# Patient Record
Sex: Male | Born: 1965 | Race: White | Hispanic: No | Marital: Married | Smoking: Former smoker
Health system: Southern US, Community
[De-identification: ages and names within clinical notes are randomized; demographics above are authoritative.]

## PROBLEM LIST (undated history)

## (undated) DIAGNOSIS — K219 Gastro-esophageal reflux disease without esophagitis: Secondary | ICD-10-CM

## (undated) DIAGNOSIS — I219 Acute myocardial infarction, unspecified: Secondary | ICD-10-CM

## (undated) DIAGNOSIS — I10 Essential (primary) hypertension: Secondary | ICD-10-CM

## (undated) HISTORY — DX: Essential (primary) hypertension: I10

## (undated) HISTORY — PX: OTHER SURGICAL HISTORY: SHX169

## (undated) HISTORY — DX: Acute myocardial infarction, unspecified: I21.9

## (undated) HISTORY — DX: Gastro-esophageal reflux disease without esophagitis: K21.9

## (undated) HISTORY — PX: FINGER SURGERY: SHX640

---

## 2004-06-01 ENCOUNTER — Other Ambulatory Visit: Payer: Self-pay

## 2004-08-07 ENCOUNTER — Other Ambulatory Visit: Payer: Self-pay

## 2004-08-07 ENCOUNTER — Inpatient Hospital Stay: Payer: Self-pay | Admitting: Internal Medicine

## 2004-09-14 ENCOUNTER — Emergency Department: Payer: Self-pay | Admitting: Emergency Medicine

## 2005-02-05 ENCOUNTER — Emergency Department: Payer: Self-pay | Admitting: Emergency Medicine

## 2005-07-01 ENCOUNTER — Emergency Department: Payer: Self-pay | Admitting: Emergency Medicine

## 2005-07-03 ENCOUNTER — Emergency Department: Payer: Self-pay | Admitting: Emergency Medicine

## 2006-10-27 ENCOUNTER — Emergency Department: Payer: Self-pay | Admitting: Emergency Medicine

## 2007-04-17 ENCOUNTER — Emergency Department: Payer: Self-pay | Admitting: Emergency Medicine

## 2007-05-29 ENCOUNTER — Emergency Department: Payer: Self-pay | Admitting: Emergency Medicine

## 2007-05-30 ENCOUNTER — Inpatient Hospital Stay: Payer: Self-pay | Admitting: Internal Medicine

## 2007-06-03 ENCOUNTER — Emergency Department: Payer: Self-pay | Admitting: Emergency Medicine

## 2007-06-03 ENCOUNTER — Other Ambulatory Visit: Payer: Self-pay

## 2008-12-11 ENCOUNTER — Emergency Department: Payer: Self-pay | Admitting: Emergency Medicine

## 2009-05-01 ENCOUNTER — Emergency Department: Payer: Self-pay | Admitting: Emergency Medicine

## 2009-08-15 ENCOUNTER — Inpatient Hospital Stay: Payer: Self-pay | Admitting: Cardiovascular Disease

## 2009-12-19 ENCOUNTER — Emergency Department: Payer: Self-pay | Admitting: Emergency Medicine

## 2010-10-13 IMAGING — CT CT STONE STUDY
1 of 2 series · 15 of 32 positions shown, 19 images · non-contrast
Comparison: none

REASON FOR EXAM: L FLANK PAIN & HEMATURIA - EVAL STONE
COMMENTS:

[Series 2: stone · axial · 0.77mm/px · z∈[+10,+472]mm · 15 of 174 slices shown, 19 images]
[im 13/174  soft-tissue]
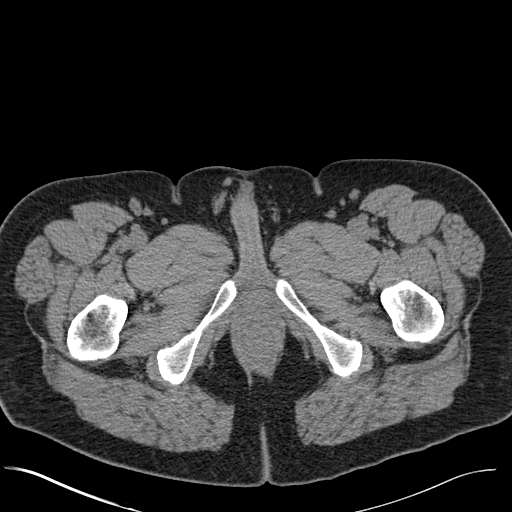
[im 13/174  bone]
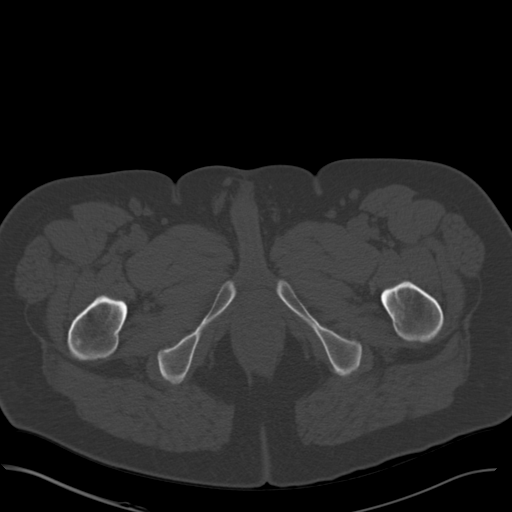
[im 25/174  soft-tissue]
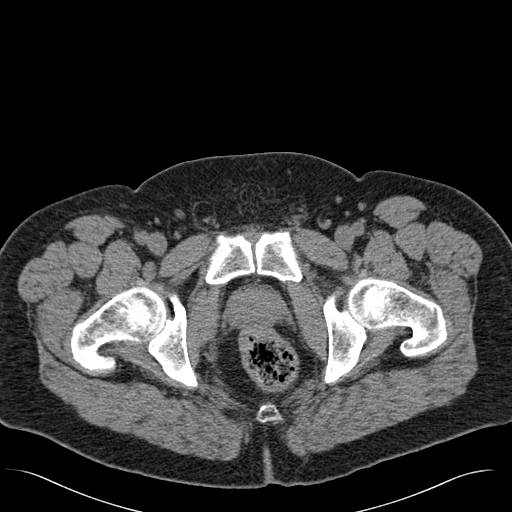
[im 38/174  soft-tissue]
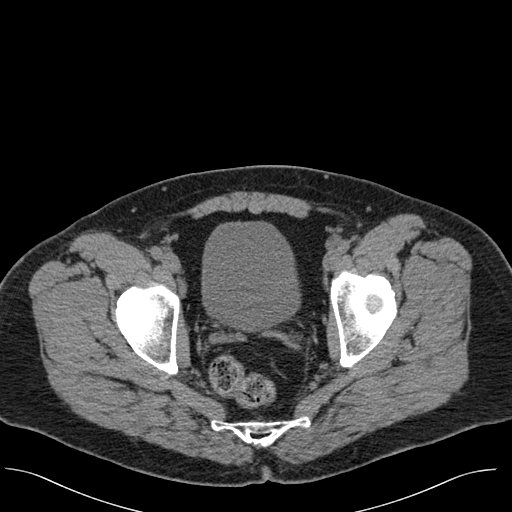
[im 50/174  soft-tissue]
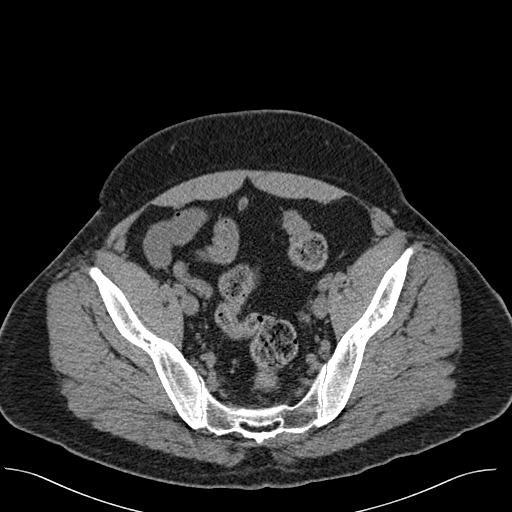
[im 62/174  soft-tissue]
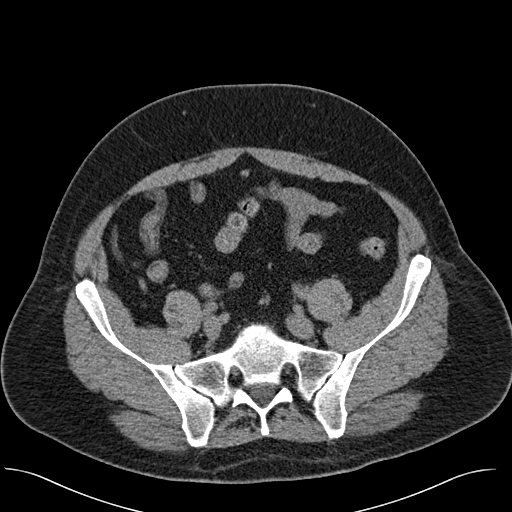
[im 75/174  soft-tissue]
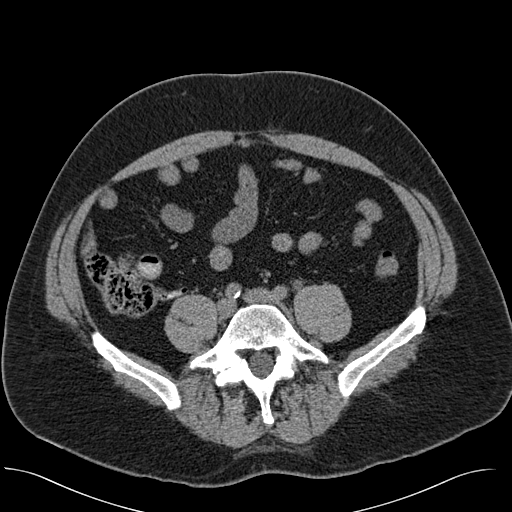
[im 87/174  soft-tissue]
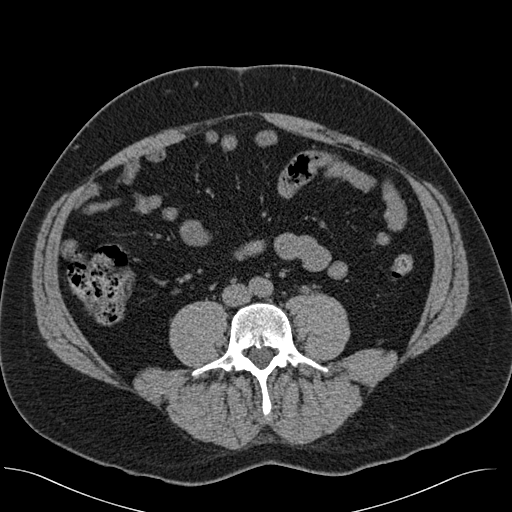
[im 99/174  soft-tissue]
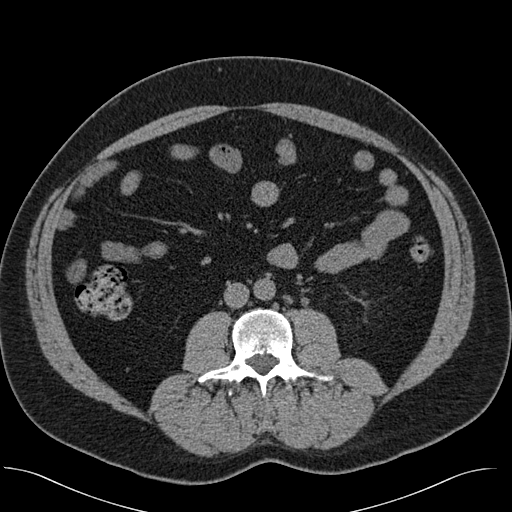
[im 112/174  soft-tissue]
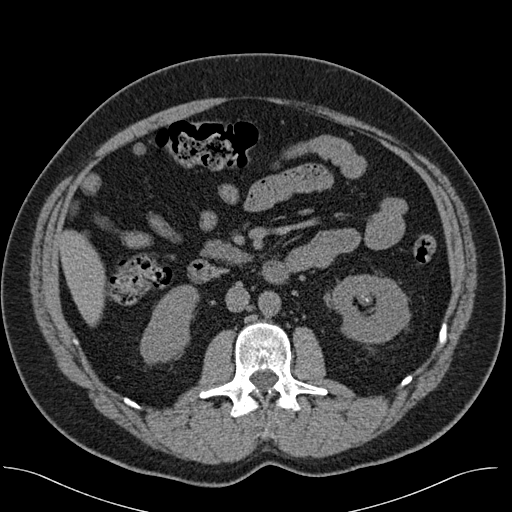
[im 112/174  bone]
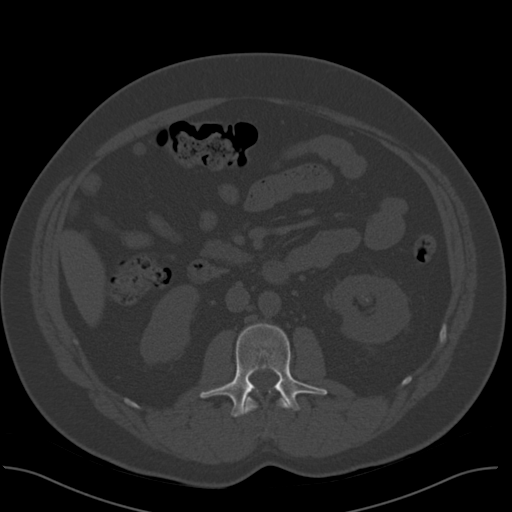
[im 124/174  soft-tissue]
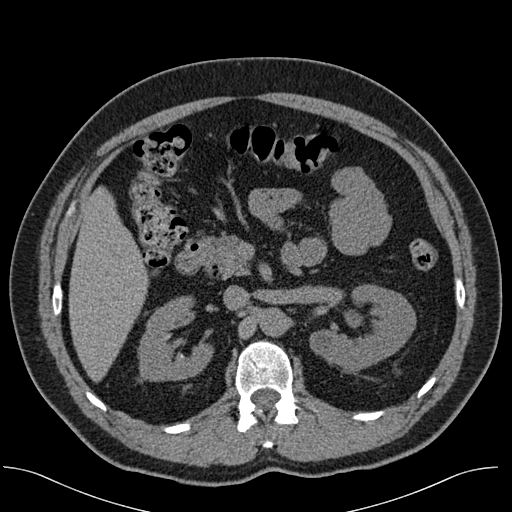
[im 136/174  soft-tissue]
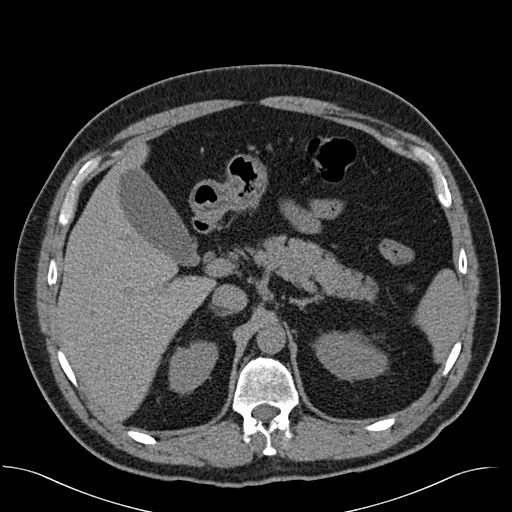
[im 149/174  soft-tissue]
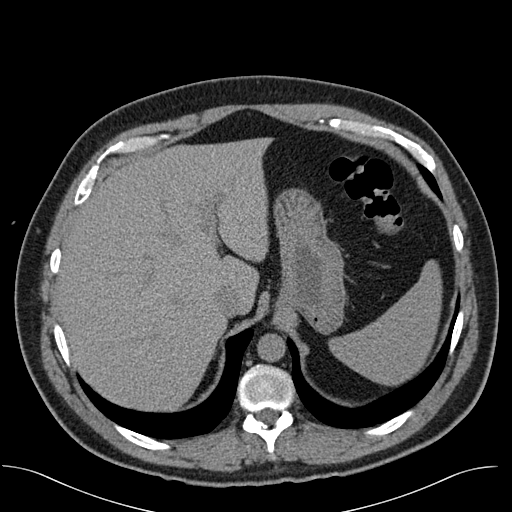
[im 149/174  lung]
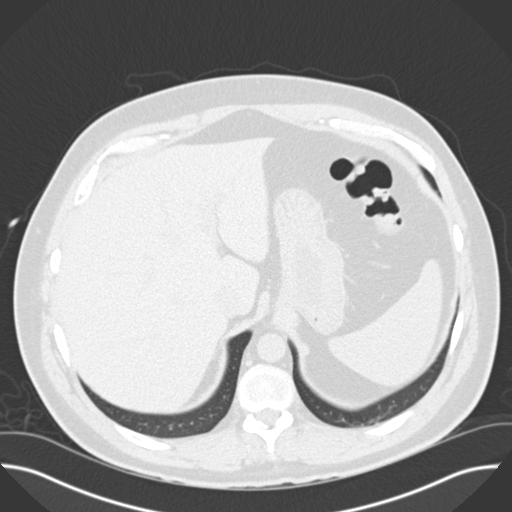
[im 155/174  lung]
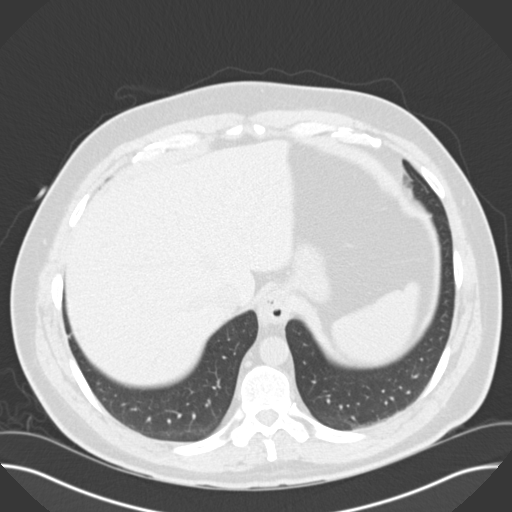
[im 161/174  soft-tissue]
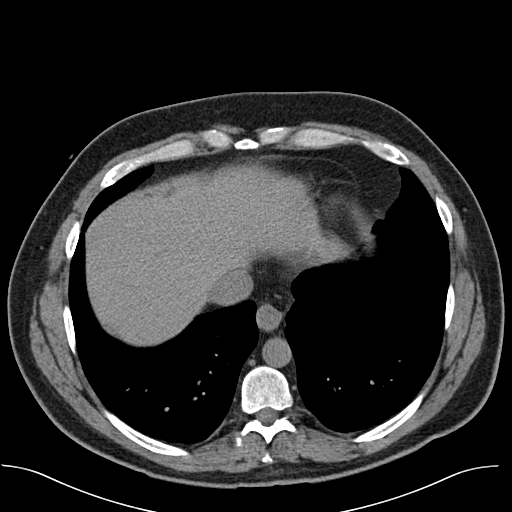
[im 161/174  lung]
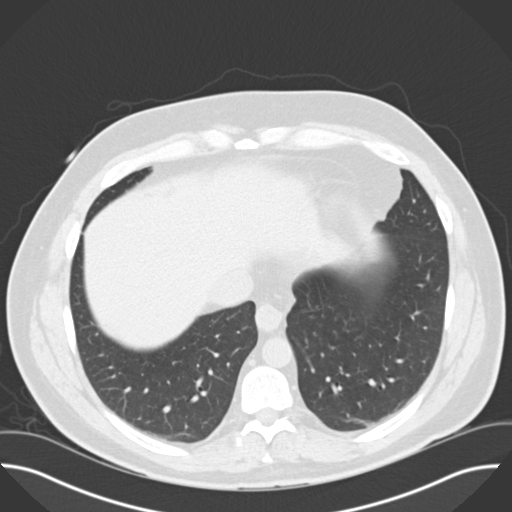
[im 167/174  lung]
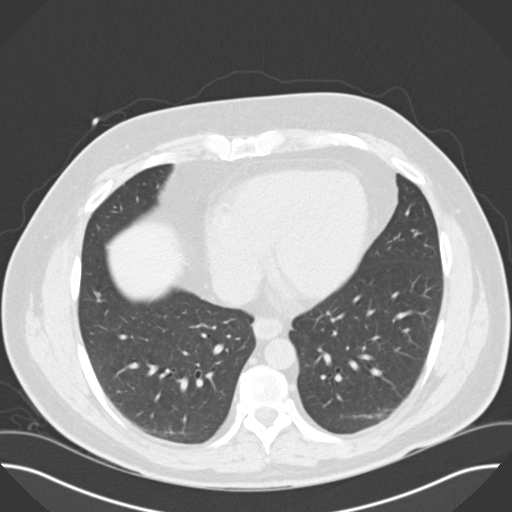

[15 of 32 positions shown; findings below may reference images not displayed]

PROCEDURE:     CT  - CT ABDOMEN /PELVIS WO (STONE)  - December 11, 2008 [DATE]

RESULT:     Emergent noncontrast CT of the abdomen and pelvis demonstrates a
3 to 4 mm distal left ureteral stone on image #127 with
hydroureteronephrosis proximal to that. The appendix is normal.
Atherosclerotic calcification is present. Bilateral nephrolithiasis is
present. There is some perinephric stranding on the left. There is no
abnormal bowel distention. No radiopaque gallstones are evident. There is a
small hiatal hernia. There is a fat-filled umbilical hernia. There appears
to be fat within a left inguinal hernia. The abdominal viscera appear to be
within normal limits otherwise for a noncontrast exam.
IMPRESSION: 1.     Distal left ureteral 3 to 4 mm calculus in with mild obstructive
changes.
2.     Bilateral nephrolithiasis.
3.     Colonic diverticula are demonstrated but there is no evidence of
acute diverticulitis.

## 2011-06-17 IMAGING — CR DG CHEST 1V PORT
1 series · 1 of 1 positions shown · non-contrast
Comparison: none

REASON FOR EXAM: cp
COMMENTS:

PROCEDURE:     DXR - DXR PORTABLE CHEST SINGLE VIEW  - August 15, 2009  [DATE]
RESULT:     Comparison is made to the previous examination of 06/03/2007.
Monitoring electrodes are present. The cardiac silhouette is borderline
enlarged. There is no edema, infiltrate, effusion or pneumothorax.

[view not recorded]
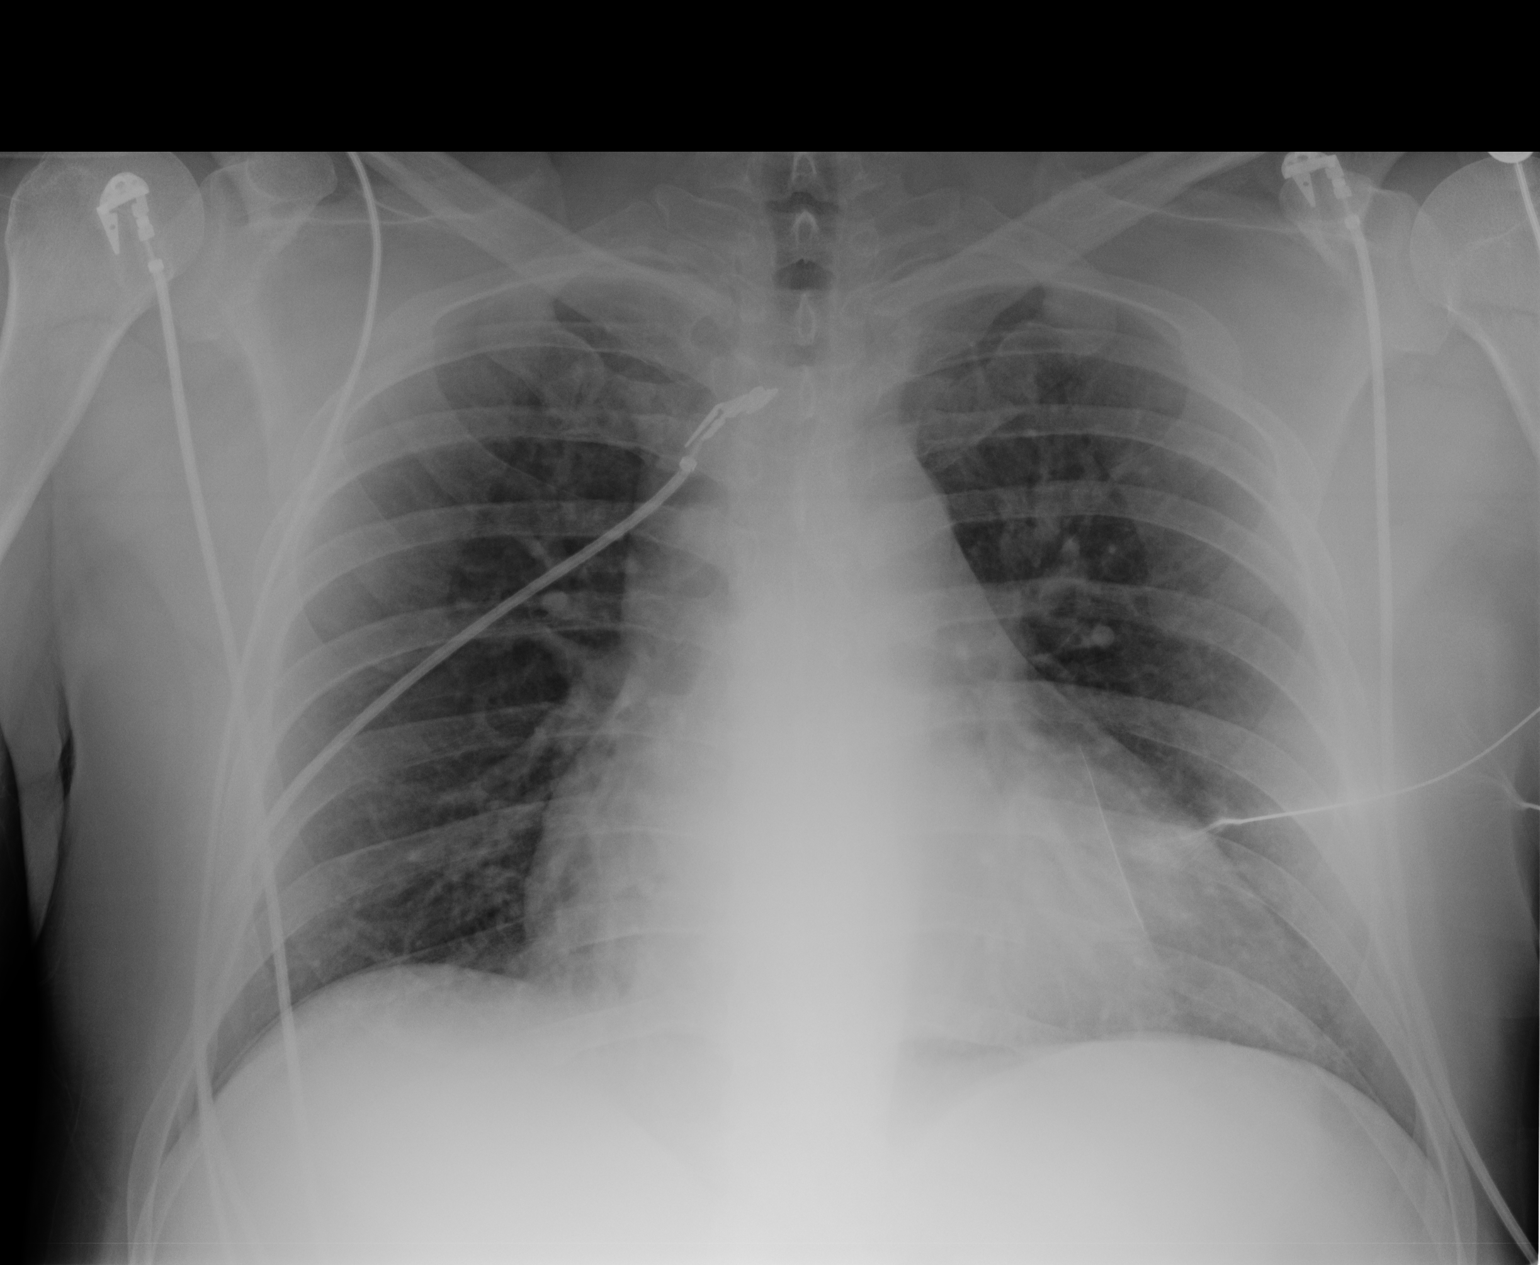

[1 of 1 positions shown; findings below may reference images not displayed]

IMPRESSION: 1.     Cardiomegaly, stable.
2.     No acute cardiopulmonary disease evident radiographically.

## 2011-10-21 IMAGING — CR DG CHEST 1V PORT
1 series · 1 of 1 positions shown · non-contrast
Comparison: none

REASON FOR EXAM: cp
COMMENTS:

[view not recorded]
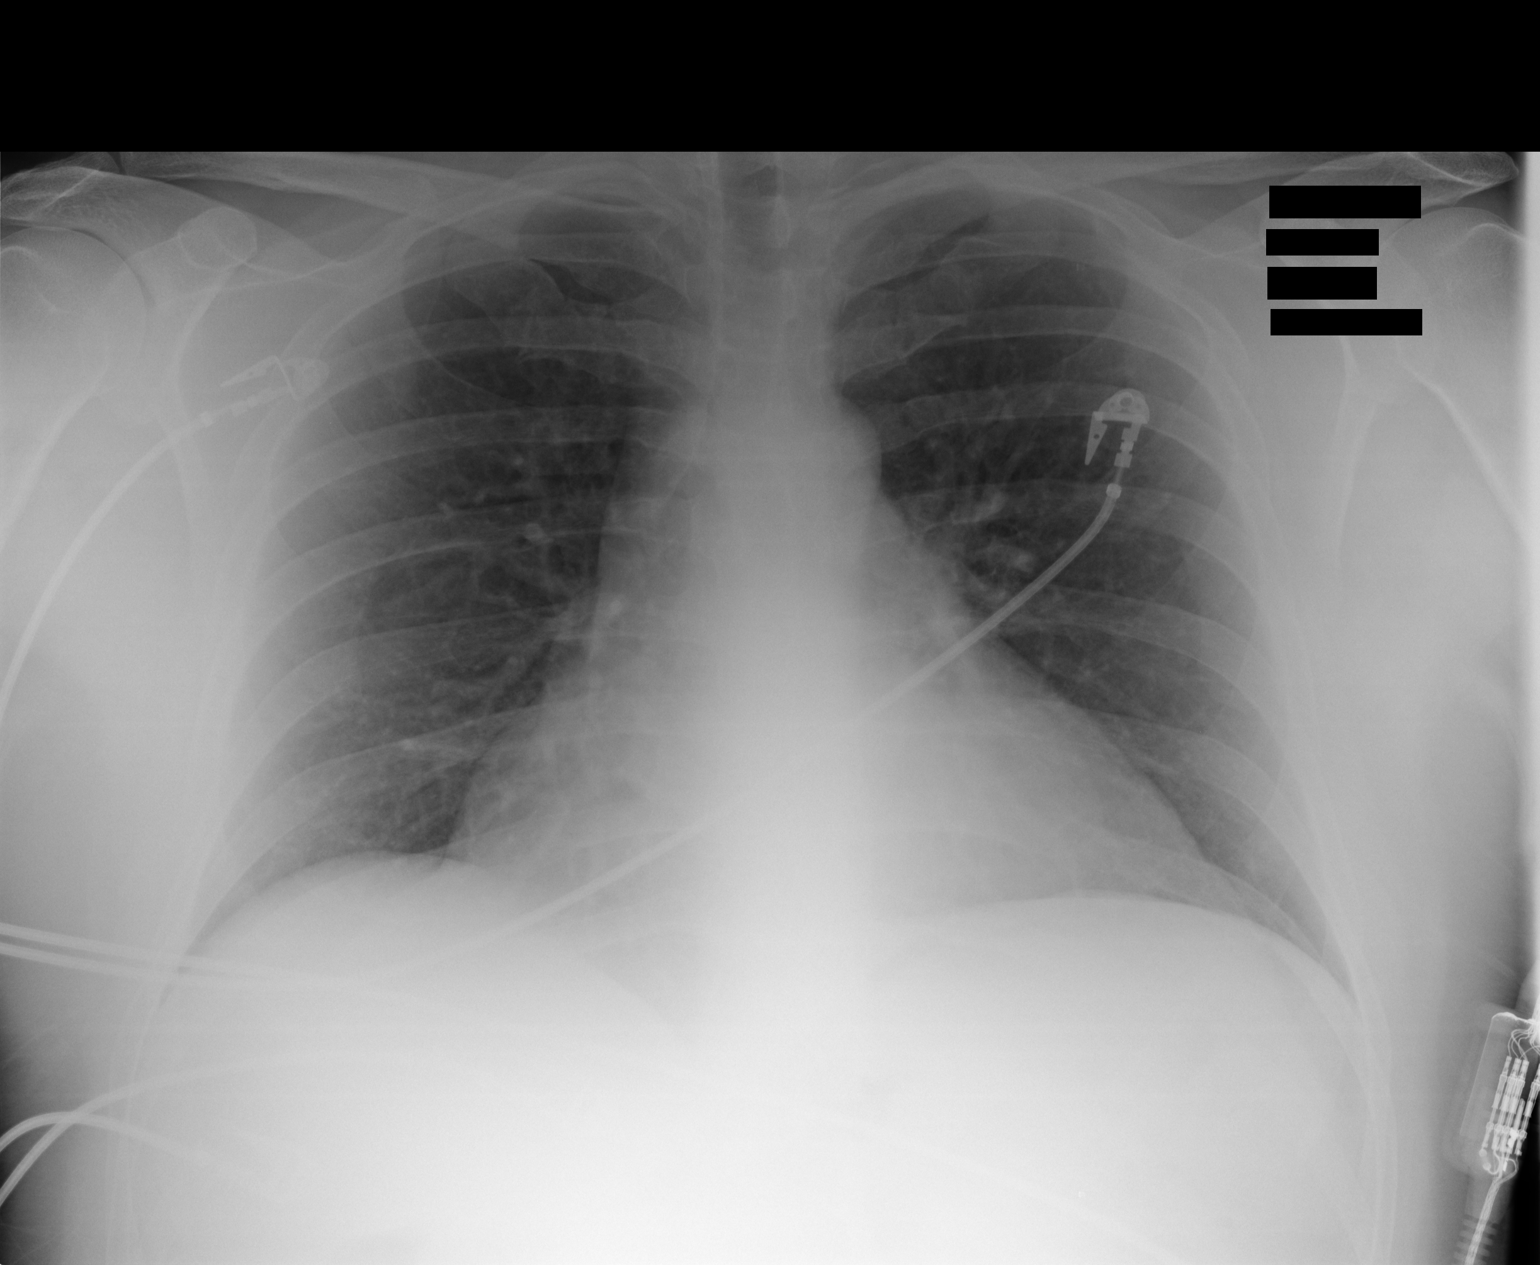

[1 of 1 positions shown; findings below may reference images not displayed]

PROCEDURE:     DXR - DXR PORTABLE CHEST SINGLE VIEW  - December 19, 2009 [DATE]

RESULT:      Comparison is made to a prior study dated 08/15/2009.

The cardiac silhouette is enlarged indicative of cardiomegaly. No focal
regions of consolidation or focal infiltrates. The visualized bony skeleton
is unremarkable.
IMPRESSION: Cardiomegaly without evidence of acute cardiopulmonary disease.

## 2012-03-03 ENCOUNTER — Emergency Department: Payer: Self-pay | Admitting: Emergency Medicine

## 2013-10-27 ENCOUNTER — Emergency Department: Payer: Self-pay | Admitting: Emergency Medicine

## 2019-04-14 ENCOUNTER — Encounter: Payer: Self-pay | Admitting: Adult Health

## 2019-04-14 ENCOUNTER — Ambulatory Visit (INDEPENDENT_AMBULATORY_CARE_PROVIDER_SITE_OTHER): Payer: BC Managed Care – PPO | Admitting: Adult Health

## 2019-04-14 ENCOUNTER — Other Ambulatory Visit: Payer: Self-pay

## 2019-04-14 VITALS — BP 146/110 | HR 79 | Resp 16 | Ht 74.0 in | Wt 241.0 lb

## 2019-04-14 DIAGNOSIS — F419 Anxiety disorder, unspecified: Secondary | ICD-10-CM | POA: Diagnosis not present

## 2019-04-14 DIAGNOSIS — L409 Psoriasis, unspecified: Secondary | ICD-10-CM | POA: Diagnosis not present

## 2019-04-14 DIAGNOSIS — Z1211 Encounter for screening for malignant neoplasm of colon: Secondary | ICD-10-CM | POA: Diagnosis not present

## 2019-04-14 DIAGNOSIS — I10 Essential (primary) hypertension: Secondary | ICD-10-CM | POA: Diagnosis not present

## 2019-04-14 MED ORDER — LISINOPRIL 10 MG PO TABS: 10.0000 mg | ORAL_TABLET | Freq: Every day | ORAL | 1 refills | Status: DC

## 2019-04-14 MED ORDER — CLOBETASOL PROPIONATE 0.05 % EX FOAM: Freq: Two times a day (BID) | CUTANEOUS | 0 refills | Status: DC

## 2019-04-14 MED ORDER — TRIAMCINOLONE ACETONIDE 0.5 % EX OINT: 1.0000 "application " | TOPICAL_OINTMENT | Freq: Two times a day (BID) | CUTANEOUS | 0 refills | Status: DC

## 2019-04-14 MED ORDER — ALPRAZOLAM 0.25 MG PO TABS: ORAL_TABLET | ORAL | 0 refills | Status: DC

## 2019-04-14 NOTE — Progress Notes (Signed)
[facility] [place], [place] [address]  Internal MEDICINE  Office Visit Note  Patient Name: Robert Clark  [id number]  [id number]  Date of Service: 04/14/2019   Complaints/HPI Pt is here for establishment of PCP. Chief Complaint  Patient presents with  . Medical Management of Chronic Issues    new patient , noticing little more fatigue   . Hypertension    bp is elevated , use to take bp medication but has not since stop seeing pcp   . Quality Metric Gaps    physical and colonscopy    HPI Pt is here to establish care.  He reports it has been a few years since he has seen a PCP. He reports feeling mostly well.  He reports he did take xanax for work stress, as well as a cholesterol medicine and blood pressure medicine. He has a history of MI at 53 yo with one stent placed. His blood pressure is elevated, and he reports he has not been able to get refills.     Current Medication: Outpatient Encounter Medications as of 04/14/2019  Medication Sig  . aspirin EC 81 MG tablet Take 81 mg by mouth daily.  . Cholecalciferol (D3 VITAMIN PO) Take by mouth.  . Cyanocobalamin ([employer] B12 GUMMIES) 500 MCG CHEW Chew 1,000 mcg by mouth 2 (two) times daily.  . Omega-3 Fatty Acids (FISH OIL) 1200 MG CAPS Take 2 capsules by mouth daily.  . Omeprazole 20 MG TBEC Take 20 mg by mouth daily.   No facility-administered encounter medications on file as of 04/14/2019.     Surgical History: Past Surgical History:  Procedure Laterality Date  . FINGER SURGERY    . heart stent      Medical History: Past Medical History:  Diagnosis Date  . GERD (gastroesophageal reflux disease)   . Heart attack ([place])   . Hypertension     Family History: Family History  Problem Relation Age of Onset  . Heart disease Father   . Heart disease Maternal Grandmother   . Heart disease Paternal Grandfather     Social History   Socioeconomic History  . Marital status: Single    Spouse name:  Not on file  . Number of children: Not on file  . Years of education: Not on file  . Highest education level: Not on file  Occupational History  . Not on file  Social Needs  . Financial resource strain: Not on file  . Food insecurity    Worry: Not on file    Inability: Not on file  . Transportation needs    Medical: Not on file    Non-medical: Not on file  Tobacco Use  . Smoking status: Former Research scientist (life sciences)  . Smokeless tobacco: Never Used  Substance and Sexual Activity  . Alcohol use: Not Currently  . Drug use: Never  . Sexual activity: Not on file  Lifestyle  . Physical activity    Days per week: Not on file    Minutes per session: Not on file  . Stress: Not on file  Relationships  . Social Herbalist on phone: Not on file    Gets together: Not on file    Attends religious service: Not on file    Active member of club or organization: Not on file    Attends meetings of clubs or organizations: Not on file    Relationship status: Not on file  . Intimate partner violence  Fear of current or ex partner: Not on file    Emotionally abused: Not on file    Physically abused: Not on file    Forced sexual activity: Not on file  Other Topics Concern  . Not on file  Social History Narrative  . Not on file     Review of Systems  Constitutional: Negative.  Negative for chills, fatigue and unexpected weight change.  HENT: Negative.  Negative for congestion, rhinorrhea, sneezing and sore throat.   Eyes: Negative for redness.  Respiratory: Negative.  Negative for cough, chest tightness and shortness of breath.   Cardiovascular: Negative.  Negative for chest pain and palpitations.  Gastrointestinal: Negative.  Negative for abdominal pain, constipation, diarrhea, nausea and vomiting.  Endocrine: Negative.   Genitourinary: Negative.  Negative for dysuria and frequency.  Musculoskeletal: Negative.  Negative for arthralgias, back pain, joint swelling and neck pain.  Skin:  Negative.  Negative for rash.  Allergic/Immunologic: Negative.   Neurological: Negative.  Negative for tremors and numbness.  Hematological: Negative for adenopathy. Does not bruise/bleed easily.  Psychiatric/Behavioral: Negative.  Negative for behavioral problems, sleep disturbance and suicidal ideas. The patient is not nervous/anxious.     Vital Signs: BP (!) 146/110   Pulse 79   Resp 16   Ht 6\' 2"  (1.88 m)   Wt 241 lb (109.3 kg)   SpO2 96%   BMI 30.94 kg/m    Physical Exam Vitals signs and nursing note reviewed.  Constitutional:      General: He is not in acute distress.    Appearance: He is well-developed. He is not diaphoretic.  HENT:     Head: Normocephalic and atraumatic.     Mouth/Throat:     Pharynx: No oropharyngeal exudate.  Eyes:     Pupils: Pupils are equal, round, and reactive to light.  Neck:     Musculoskeletal: Normal range of motion and neck supple.     Thyroid: No thyromegaly.     Vascular: No JVD.     Trachea: No tracheal deviation.  Cardiovascular:     Rate and Rhythm: Normal rate and regular rhythm.     Heart sounds: Normal heart sounds. No murmur. No friction rub. No gallop.   Pulmonary:     Effort: Pulmonary effort is normal. No respiratory distress.     Breath sounds: Normal breath sounds. No wheezing or rales.  Chest:     Chest wall: No tenderness.  Abdominal:     Palpations: Abdomen is soft.     Tenderness: There is no abdominal tenderness. There is no guarding.  Musculoskeletal: Normal range of motion.  Lymphadenopathy:     Cervical: No cervical adenopathy.  Skin:    General: Skin is warm and dry.  Neurological:     Mental Status: He is alert and oriented to person, place, and time.     Cranial Nerves: No cranial nerve deficit.  Psychiatric:        Behavior: Behavior normal.        Thought Content: Thought content normal.        Judgment: Judgment normal.    Assessment/Plan: 1. Essential hypertension Refilled patients  lisinopril, will recheck patient in 2 weeks for effectiveness.   - lisinopril (ZESTRIL) 10 MG tablet; Take 1 tablet (10 mg total) by mouth daily.  Dispense: 30 tablet; Refill: 1  2. Anxiety Reviewed risks and possible side effects associated with taking opiates, benzodiazepines and other CNS depressants. Combination of these could cause dizziness and drowsiness.  Advised patient not to drive or operate machinery when taking these medications, as patient's and other's life can be at risk and will have consequences. Patient verbalized understanding in this matter. Dependence and abuse for these drugs will be monitored closely. A Controlled substance policy and procedure is on file which allows [facility] to order a urine drug screen test at any visit. Patient understands and agrees with the plan - ALPRAZolam (XANAX) 0.25 MG tablet; Half to one tab tab as needed for panic attacks  Dispense: 10 tablet; Refill: 0  3. Psoriasis Provided patient with refills on his psoriasis medications.  - clobetasol (OLUX) 0.05 % topical foam; Apply topically 2 (two) times daily.  Dispense: 100 g; Refill: 0 - triamcinolone ointment (KENALOG) 0.5 %; Apply 1 application topically 2 (two) times daily.  Dispense: 60 g; Refill: 0  4. Screening for colon cancer Pt needs colonoscopy, referral placed.  - Ambulatory referral to Gastroenterology  General Counseling: Myrla Halstedravis verbalizes understanding of the findings of todays visit and agrees with plan of treatment. I have discussed any further diagnostic evaluation that may be needed or ordered today. We also reviewed his medications today. he has been encouraged to call the office with any questions or concerns that should arise related to todays visit.  No orders of the defined types were placed in this encounter.   No orders of the defined types were placed in this encounter.   Time spent: 30 Minutes   This patient was seen by Blima LedgerAdam Janal Haak AGNP-C in  Collaboration with Dr Lyndon CodeFozia M Khan as a part of collaborative care agreement  Johnna AcostaAdam J. Marq Rebello AGNP-C Internal Medicine

## 2019-04-16 ENCOUNTER — Encounter: Payer: Self-pay | Admitting: Adult Health

## 2019-04-22 ENCOUNTER — Encounter: Payer: Self-pay | Admitting: *Deleted

## 2019-04-29 ENCOUNTER — Other Ambulatory Visit: Payer: Self-pay

## 2019-04-29 ENCOUNTER — Ambulatory Visit (INDEPENDENT_AMBULATORY_CARE_PROVIDER_SITE_OTHER): Payer: BC Managed Care – PPO | Admitting: Adult Health

## 2019-04-29 ENCOUNTER — Encounter: Payer: Self-pay | Admitting: Adult Health

## 2019-04-29 VITALS — BP 144/91 | HR 84 | Resp 16 | Ht 73.0 in | Wt 241.6 lb

## 2019-04-29 DIAGNOSIS — I10 Essential (primary) hypertension: Secondary | ICD-10-CM | POA: Diagnosis not present

## 2019-04-29 DIAGNOSIS — F419 Anxiety disorder, unspecified: Secondary | ICD-10-CM | POA: Diagnosis not present

## 2019-04-29 DIAGNOSIS — L409 Psoriasis, unspecified: Secondary | ICD-10-CM | POA: Diagnosis not present

## 2019-04-29 MED ORDER — TRIAMCINOLONE ACETONIDE 0.5 % EX OINT: 1.0000 "application " | TOPICAL_OINTMENT | Freq: Two times a day (BID) | CUTANEOUS | 2 refills | Status: DC

## 2019-04-29 MED ORDER — CLOBETASOL PROPIONATE 0.05 % EX LOTN: 1.0000 "application " | TOPICAL_LOTION | Freq: Two times a day (BID) | CUTANEOUS | 3 refills | Status: DC

## 2019-04-29 MED ORDER — ESCITALOPRAM OXALATE 10 MG PO TABS: 10.0000 mg | ORAL_TABLET | Freq: Every day | ORAL | 1 refills | Status: DC

## 2019-04-29 MED ORDER — ALPRAZOLAM 0.25 MG PO TABS: ORAL_TABLET | ORAL | 0 refills | Status: DC

## 2019-04-29 NOTE — Progress Notes (Signed)
[facility] [place], [place] [address]  Internal MEDICINE  Office Visit Note  Patient Name: Robert Clark  [id number]  [id number]  Date of Service: 04/29/2019  Chief Complaint  Patient presents with  . Medical Management of Chronic Issues    2 week follow up  . Hypertension    HPI  Pt is here for follow up on HTN.  He was started on lisinopril 10mg  daily, and has been taking it.  He reports his headaches have gotten much better.  He can remember 1-2 over the last two weeks.    Current Medication: Outpatient Encounter Medications as of 04/29/2019  Medication Sig  . ALPRAZolam (XANAX) 0.25 MG tablet Half to one tab tab as needed for panic attacks  . aspirin EC 81 MG tablet Take 81 mg by mouth daily.  . Cholecalciferol (D3 VITAMIN PO) Take by mouth.  . Cyanocobalamin ([employer]S B12 GUMMIES) 500 MCG CHEW Chew 1,000 mcg by mouth 2 (two) times daily.  [name] Kitchen lisinopril (ZESTRIL) 10 MG tablet Take 1 tablet (10 mg total) by mouth daily.  . Omega-3 Fatty Acids (FISH OIL) 1200 MG CAPS Take 2 capsules by mouth daily.  . Omeprazole 20 MG TBEC Take 20 mg by mouth daily.  [name] Kitchen triamcinolone ointment (KENALOG) 0.5 % Apply 1 application topically 2 (two) times daily.  . [DISCONTINUED] ALPRAZolam (XANAX) 0.25 MG tablet Half to one tab tab as needed for panic attacks  . [DISCONTINUED] clobetasol (OLUX) 0.05 % topical foam Apply topically 2 (two) times daily.  . [DISCONTINUED] triamcinolone ointment (KENALOG) 0.5 % Apply 1 application topically 2 (two) times daily.  . Clobetasol Propionate (CLOBEX) 0.05 % lotion Apply 1 application topically 2 (two) times daily.  [name] Kitchen escitalopram (LEXAPRO) 10 MG tablet Take 1 tablet (10 mg total) by mouth daily.   No facility-administered encounter medications on file as of 04/29/2019.     Surgical History: Past Surgical History:  Procedure Laterality Date  . FINGER SURGERY    . heart stent      Medical History: Past Medical History:   Diagnosis Date  . GERD (gastroesophageal reflux disease)   . Heart attack ([place])   . Hypertension     Family History: Family History  Problem Relation Age of Onset  . Heart disease Father   . Heart disease Maternal Grandmother   . Heart disease Paternal Grandfather     Social History   Socioeconomic History  . Marital status: Single    Spouse name: Not on file  . Number of children: Not on file  . Years of education: Not on file  . Highest education level: Not on file  Occupational History  . Not on file  Social Needs  . Financial resource strain: Not on file  . Food insecurity    Worry: Not on file    Inability: Not on file  . Transportation needs    Medical: Not on file    Non-medical: Not on file  Tobacco Use  . Smoking status: Former Research scientist (life sciences)  . Smokeless tobacco: Never Used  Substance and Sexual Activity  . Alcohol use: Not Currently  . Drug use: Never  . Sexual activity: Not on file  Lifestyle  . Physical activity    Days per week: Not on file    Minutes per session: Not on file  . Stress: Not on file  Relationships  . Social Herbalist on phone: Not on file    Gets together:  Not on file    Attends religious service: Not on file    Active member of club or organization: Not on file    Attends meetings of clubs or organizations: Not on file    Relationship status: Not on file  . Intimate partner violence    Fear of current or ex partner: Not on file    Emotionally abused: Not on file    Physically abused: Not on file    Forced sexual activity: Not on file  Other Topics Concern  . Not on file  Social History Narrative  . Not on file      Review of Systems  Constitutional: Negative.  Negative for chills, fatigue and unexpected weight change.  HENT: Negative.  Negative for congestion, rhinorrhea, sneezing and sore throat.   Eyes: Negative for redness.  Respiratory: Negative.  Negative for cough, chest tightness and shortness of breath.    Cardiovascular: Negative.  Negative for chest pain and palpitations.  Gastrointestinal: Negative.  Negative for abdominal pain, constipation, diarrhea, nausea and vomiting.  Endocrine: Negative.   Genitourinary: Negative.  Negative for dysuria and frequency.  Musculoskeletal: Negative.  Negative for arthralgias, back pain, joint swelling and neck pain.  Skin: Negative.  Negative for rash.  Allergic/Immunologic: Negative.   Neurological: Negative.  Negative for tremors and numbness.  Hematological: Negative for adenopathy. Does not bruise/bleed easily.  Psychiatric/Behavioral: Negative.  Negative for behavioral problems, sleep disturbance and suicidal ideas. The patient is not nervous/anxious.     Vital Signs: BP (!) 144/91 (BP Location: Left Arm, Patient Position: Sitting, Cuff Size: Normal)   Pulse 84   Resp 16   Ht 6\' 1"  (1.854 m)   Wt 241 lb 9.6 oz (109.6 kg)   SpO2 94%   BMI 31.88 kg/m    Physical Exam Vitals signs and nursing note reviewed.  Constitutional:      General: He is not in acute distress.    Appearance: He is well-developed. He is not diaphoretic.  HENT:     Head: Normocephalic and atraumatic.     Mouth/Throat:     Pharynx: No oropharyngeal exudate.  Eyes:     Pupils: Pupils are equal, round, and reactive to light.  Neck:     Musculoskeletal: Normal range of motion and neck supple.     Thyroid: No thyromegaly.     Vascular: No JVD.     Trachea: No tracheal deviation.  Cardiovascular:     Rate and Rhythm: Normal rate and regular rhythm.     Heart sounds: Normal heart sounds. No murmur. No friction rub. No gallop.   Pulmonary:     Effort: Pulmonary effort is normal. No respiratory distress.     Breath sounds: Normal breath sounds. No wheezing or rales.  Chest:     Chest wall: No tenderness.  Abdominal:     Palpations: Abdomen is soft.     Tenderness: There is no abdominal tenderness. There is no guarding.  Musculoskeletal: Normal range of motion.   Lymphadenopathy:     Cervical: No cervical adenopathy.  Skin:    General: Skin is warm and dry.  Neurological:     Mental Status: He is alert and oriented to person, place, and time.     Cranial Nerves: No cranial nerve deficit.  Psychiatric:        Behavior: Behavior normal.        Thought Content: Thought content normal.        Judgment: Judgment normal.  Assessment/Plan: 1. Essential hypertension Slightly elevated, however much improved.  Continue to take lisinopril as directed, and follow up in 6 weeks.  2. Anxiety Starting patient on lexapro for daily maintanace of anxiety, in an effort o reduce symptoms and the amount of xanax he is currently requiring.  - escitalopram (LEXAPRO) 10 MG tablet; Take 1 tablet (10 mg total) by mouth daily.  Dispense: 30 tablet; Refill: 1 - ALPRAZolam (XANAX) 0.25 MG tablet; Half to one tab tab as needed for panic attacks  Dispense: 10 tablet; Refill: 0  3. Psoriasis Improved, continue to use creams as directed.  - Clobetasol Propionate (CLOBEX) 0.05 % lotion; Apply 1 application topically 2 (two) times daily.  Dispense: 118 mL; Refill: 3 - triamcinolone ointment (KENALOG) 0.5 %; Apply 1 application topically 2 (two) times daily.  Dispense: 60 g; Refill: 2  General Counseling: Aldyn verbalizes understanding of the findings of todays visit and agrees with plan of treatment. I have discussed any further diagnostic evaluation that may be needed or ordered today. We also reviewed his medications today. he has been encouraged to call the office with any questions or concerns that should arise related to todays visit.    No orders of the defined types were placed in this encounter.   Meds ordered this encounter  Medications  . Clobetasol Propionate (CLOBEX) 0.05 % lotion    Sig: Apply 1 application topically 2 (two) times daily.    Dispense:  118 mL    Refill:  3  . triamcinolone ointment (KENALOG) 0.5 %    Sig: Apply 1 application  topically 2 (two) times daily.    Dispense:  60 g    Refill:  2  . escitalopram (LEXAPRO) 10 MG tablet    Sig: Take 1 tablet (10 mg total) by mouth daily.    Dispense:  30 tablet    Refill:  1  . ALPRAZolam (XANAX) 0.25 MG tablet    Sig: Half to one tab tab as needed for panic attacks    Dispense:  10 tablet    Refill:  0    Time spent: 15 Minutes   This patient was seen by Blima LedgerAdam Kirstin Kugler AGNP-C in Collaboration with Dr Lyndon CodeFozia M Khan as a part of collaborative care agreement     Johnna AcostaAdam J. Rayetta Veith AGNP-C Internal medicine

## 2019-06-07 ENCOUNTER — Other Ambulatory Visit: Payer: Self-pay | Admitting: Adult Health

## 2019-06-07 DIAGNOSIS — I10 Essential (primary) hypertension: Secondary | ICD-10-CM

## 2019-06-10 ENCOUNTER — Other Ambulatory Visit: Payer: Self-pay

## 2019-06-10 ENCOUNTER — Ambulatory Visit: Payer: Self-pay | Admitting: Adult Health

## 2019-06-10 DIAGNOSIS — Z20822 Contact with and (suspected) exposure to covid-19: Secondary | ICD-10-CM

## 2019-06-11 LAB — NOVEL CORONAVIRUS, NAA: SARS-CoV-2, NAA: NOT DETECTED

## 2019-06-16 ENCOUNTER — Other Ambulatory Visit: Payer: Self-pay

## 2019-06-16 DIAGNOSIS — Z20822 Contact with and (suspected) exposure to covid-19: Secondary | ICD-10-CM

## 2019-06-18 ENCOUNTER — Ambulatory Visit: Payer: Self-pay | Admitting: Adult Health

## 2019-06-18 ENCOUNTER — Telehealth: Payer: Self-pay | Admitting: General Practice

## 2019-06-18 LAB — NOVEL CORONAVIRUS, NAA: SARS-CoV-2, NAA: NOT DETECTED

## 2019-06-18 NOTE — Telephone Encounter (Signed)
Patient informed of negative covid-19 result. Patient verbalized understanding.  °

## 2019-06-24 ENCOUNTER — Ambulatory Visit: Payer: BC Managed Care – PPO | Admitting: Adult Health

## 2019-06-24 ENCOUNTER — Encounter: Payer: Self-pay | Admitting: Adult Health

## 2019-06-24 ENCOUNTER — Other Ambulatory Visit: Payer: Self-pay

## 2019-06-24 VITALS — BP 133/87 | HR 94 | Resp 16 | Ht 73.0 in | Wt 240.0 lb

## 2019-06-24 DIAGNOSIS — F419 Anxiety disorder, unspecified: Secondary | ICD-10-CM | POA: Diagnosis not present

## 2019-06-24 DIAGNOSIS — I10 Essential (primary) hypertension: Secondary | ICD-10-CM

## 2019-06-24 DIAGNOSIS — L409 Psoriasis, unspecified: Secondary | ICD-10-CM | POA: Diagnosis not present

## 2019-06-24 MED ORDER — CLOBETASOL PROPIONATE 0.05 % EX GEL: 2.0000 "application " | Freq: Two times a day (BID) | CUTANEOUS | 3 refills | Status: DC

## 2019-06-24 MED ORDER — ALPRAZOLAM 0.25 MG PO TABS: ORAL_TABLET | ORAL | 0 refills | Status: DC

## 2019-06-24 NOTE — Progress Notes (Signed)
[facility] [address] [place], [place] [address]  Internal MEDICINE  Office Visit Note  Patient Name: Robert Clark  [id number]  [id number]  Date of Service: 06/25/2019  Chief Complaint  Patient presents with  . Hypertension  . Gastroesophageal Reflux  . Medication Management    pt stopped taking lexapro, he did not feel good on it, tired all the time and stomach cramping   . Medication Refill    would like a refill on xanax    HPI Pt is here for follow up on GERD, HTN, and anxiety.  His blood pressure is doing well at this time.  He denies any issues with his GERD.  He reports we started him on Lexapro, and he did ok, but became sick with nausea, abdominal cramping, and headache.  He was unable to eat for a few days and felt very bad, so he discontinued his lexapro.   He has not needed his xanax in at least 3 weeks.  He would like a refill at this time.     Current Medication: Outpatient Encounter Medications as of 06/24/2019  Medication Sig  . ALPRAZolam (XANAX) 0.25 MG tablet Half to one tab tab as needed for panic attacks  . aspirin EC 81 MG tablet Take 81 mg by mouth daily.  . Cholecalciferol (D3 VITAMIN PO) Take by mouth.  . Clobetasol Propionate (CLOBEX) 0.05 % lotion Apply 1 application topically 2 (two) times daily.  . Cyanocobalamin ([employer] B12 GUMMIES) 500 MCG CHEW Chew 1,000 mcg by mouth 2 (two) times daily.  [name] Kitchen lisinopril (ZESTRIL) 10 MG tablet TAKE 1 TABLET(10 MG) BY MOUTH DAILY  . Omega-3 Fatty Acids (FISH OIL) 1200 MG CAPS Take 2 capsules by mouth daily.  . Omeprazole 20 MG TBEC Take 20 mg by mouth daily.  [name] Kitchen triamcinolone ointment (KENALOG) 0.5 % Apply 1 application topically 2 (two) times daily.  . [DISCONTINUED] ALPRAZolam (XANAX) 0.25 MG tablet Half to one tab tab as needed for panic attacks  . clobetasol (TEMOVATE) 0.05 % GEL Apply 2 application topically 2 (two) times daily.  . [DISCONTINUED] escitalopram (LEXAPRO) 10 MG tablet Take 1  tablet (10 mg total) by mouth daily. (Patient not taking: Reported on 06/24/2019)   No facility-administered encounter medications on file as of 06/24/2019.     Surgical History: Past Surgical History:  Procedure Laterality Date  . FINGER SURGERY    . heart stent      Medical History: Past Medical History:  Diagnosis Date  . GERD (gastroesophageal reflux disease)   . Heart attack (HCC)   . Hypertension     Family History: Family History  Problem Relation Age of Onset  . Heart disease Father   . Heart disease Maternal Grandmother   . Heart disease Paternal Grandfather     Social History   Socioeconomic History  . Marital status: Single    Spouse name: Not on file  . Number of children: Not on file  . Years of education: Not on file  . Highest education level: Not on file  Occupational History  . Not on file  Social Needs  . Financial resource strain: Not on file  . Food insecurity    Worry: Not on file    Inability: Not on file  . Transportation needs    Medical: Not on file    Non-medical: Not on file  Tobacco Use  . Smoking status: Former Games developer  . Smokeless tobacco: Never Used  Substance and Sexual  Activity  . Alcohol use: Not Currently  . Drug use: Never  . Sexual activity: Not on file  Lifestyle  . Physical activity    Days per week: Not on file    Minutes per session: Not on file  . Stress: Not on file  Relationships  . Social Musicianconnections    Talks on phone: Not on file    Gets together: Not on file    Attends religious service: Not on file    Active member of club or organization: Not on file    Attends meetings of clubs or organizations: Not on file    Relationship status: Not on file  . Intimate partner violence    Fear of current or ex partner: Not on file    Emotionally abused: Not on file    Physically abused: Not on file    Forced sexual activity: Not on file  Other Topics Concern  . Not on file  Social History Narrative  . Not on  file      Review of Systems  Constitutional: Negative.  Negative for chills, fatigue and unexpected weight change.  HENT: Negative.  Negative for congestion, rhinorrhea, sneezing and sore throat.   Eyes: Negative for redness.  Respiratory: Negative.  Negative for cough, chest tightness and shortness of breath.   Cardiovascular: Negative.  Negative for chest pain and palpitations.  Gastrointestinal: Negative.  Negative for abdominal pain, constipation, diarrhea, nausea and vomiting.  Endocrine: Negative.   Genitourinary: Negative.  Negative for dysuria and frequency.  Musculoskeletal: Negative.  Negative for arthralgias, back pain, joint swelling and neck pain.  Skin: Negative.  Negative for rash.  Allergic/Immunologic: Negative.   Neurological: Negative.  Negative for tremors and numbness.  Hematological: Negative for adenopathy. Does not bruise/bleed easily.  Psychiatric/Behavioral: Negative.  Negative for behavioral problems, sleep disturbance and suicidal ideas. The patient is not nervous/anxious.     Vital Signs: BP 133/87   Pulse 94   Resp 16   Ht 6\' 1"  (1.854 m)   Wt 240 lb (108.9 kg)   SpO2 97%   BMI 31.66 kg/m    Physical Exam Vitals signs and nursing note reviewed.  Constitutional:      General: He is not in acute distress.    Appearance: He is well-developed. He is not diaphoretic.  HENT:     Head: Normocephalic and atraumatic.     Mouth/Throat:     Pharynx: No oropharyngeal exudate.  Eyes:     Pupils: Pupils are equal, round, and reactive to light.  Neck:     Musculoskeletal: Normal range of motion and neck supple.     Thyroid: No thyromegaly.     Vascular: No JVD.     Trachea: No tracheal deviation.  Cardiovascular:     Rate and Rhythm: Normal rate and regular rhythm.     Heart sounds: Normal heart sounds. No murmur. No friction rub. No gallop.   Pulmonary:     Effort: Pulmonary effort is normal. No respiratory distress.     Breath sounds: Normal  breath sounds. No wheezing or rales.  Chest:     Chest wall: No tenderness.  Abdominal:     Palpations: Abdomen is soft.     Tenderness: There is no abdominal tenderness. There is no guarding.  Musculoskeletal: Normal range of motion.  Lymphadenopathy:     Cervical: No cervical adenopathy.  Skin:    General: Skin is warm and dry.  Neurological:     Mental Status:  He is alert and oriented to person, place, and time.     Cranial Nerves: No cranial nerve deficit.  Psychiatric:        Behavior: Behavior normal.        Thought Content: Thought content normal.        Judgment: Judgment normal.    Assessment/Plan: 1. Psoriasis Continue to use clobetasol and triamcinalone as prescribed - clobetasol (TEMOVATE) 0.05 % GEL; Apply 2 application topically 2 (two) times daily.  Dispense: 60 g; Refill: 3  2. Anxiety Reviewed risks and possible side effects associated with taking opiates, benzodiazepines and other CNS depressants. Combination of these could cause dizziness and drowsiness. Advised patient not to drive or operate machinery when taking these medications, as patient's and other's life can be at risk and will have consequences. Patient verbalized understanding in this matter. Dependence and abuse for these drugs will be monitored closely. A Controlled substance policy and procedure is on file which allows [facility] to order a urine drug screen test at any visit. Patient understands and agrees with the plan - ALPRAZolam (XANAX) 0.25 MG tablet; Half to one tab tab as needed for panic attacks  Dispense: 20 tablet; Refill: 0  3. Essential hypertension *Stable, continue present management.  General Counseling: deaunte dente understanding of the findings of todays visit and agrees with plan of treatment. I have discussed any further diagnostic evaluation that may be needed or ordered today. We also reviewed his medications today. he has been encouraged to call the office with  any questions or concerns that should arise related to todays visit.    No orders of the defined types were placed in this encounter.   Meds ordered this encounter  Medications  . clobetasol (TEMOVATE) 0.05 % GEL    Sig: Apply 2 application topically 2 (two) times daily.    Dispense:  60 g    Refill:  3  . ALPRAZolam (XANAX) 0.25 MG tablet    Sig: Half to one tab tab as needed for panic attacks    Dispense:  20 tablet    Refill:  0    Time spent: 15 Minutes   This patient was seen by Orson Gear AGNP-C in Collaboration with Dr Lavera Guise as a part of collaborative care agreement     Kendell Bane AGNP-C Internal medicine

## 2019-06-27 ENCOUNTER — Other Ambulatory Visit: Payer: Self-pay | Admitting: Adult Health

## 2019-06-27 DIAGNOSIS — F419 Anxiety disorder, unspecified: Secondary | ICD-10-CM

## 2019-08-03 ENCOUNTER — Other Ambulatory Visit: Payer: Self-pay

## 2019-08-03 DIAGNOSIS — I10 Essential (primary) hypertension: Secondary | ICD-10-CM

## 2019-08-03 MED ORDER — LISINOPRIL 10 MG PO TABS: ORAL_TABLET | ORAL | 4 refills | Status: DC

## 2019-09-22 ENCOUNTER — Telehealth: Payer: Self-pay

## 2019-09-22 NOTE — Telephone Encounter (Signed)
Confirmed appointment with patient. klh °

## 2019-09-24 ENCOUNTER — Encounter: Payer: Self-pay | Admitting: Adult Health

## 2019-09-24 ENCOUNTER — Ambulatory Visit (INDEPENDENT_AMBULATORY_CARE_PROVIDER_SITE_OTHER): Payer: BC Managed Care – PPO | Admitting: Adult Health

## 2019-09-24 VITALS — Ht 73.0 in | Wt 242.0 lb

## 2019-09-24 DIAGNOSIS — I10 Essential (primary) hypertension: Secondary | ICD-10-CM | POA: Diagnosis not present

## 2019-09-24 DIAGNOSIS — L409 Psoriasis, unspecified: Secondary | ICD-10-CM

## 2019-09-24 DIAGNOSIS — F419 Anxiety disorder, unspecified: Secondary | ICD-10-CM

## 2019-09-24 MED ORDER — CLOBETASOL PROPIONATE 0.05 % EX LOTN: 1.0000 "application " | TOPICAL_LOTION | Freq: Two times a day (BID) | CUTANEOUS | 3 refills | Status: AC

## 2019-09-24 MED ORDER — CLOBETASOL PROPIONATE 0.05 % EX GEL: 2.0000 "application " | Freq: Two times a day (BID) | CUTANEOUS | 3 refills | Status: AC

## 2019-09-24 MED ORDER — TRIAMCINOLONE ACETONIDE 0.5 % EX OINT: 1.0000 "application " | TOPICAL_OINTMENT | Freq: Two times a day (BID) | CUTANEOUS | 2 refills | Status: AC

## 2019-09-24 MED ORDER — ALPRAZOLAM 0.25 MG PO TABS: ORAL_TABLET | ORAL | 0 refills | Status: DC

## 2019-09-24 NOTE — Progress Notes (Signed)
[facility] [address] [place], [place] [address]  Internal MEDICINE  Telephone Visit  Patient Name: Robert Clark  [id number]  [id number]  Date of Service: 09/24/2019  I connected with the patient at 247 by telephone and verified the patients identity using two identifiers.   I discussed the limitations, risks, security and privacy concerns of performing an evaluation and management service by telephone and the availability of in person appointments. I also discussed with the patient that there may be a patient responsible charge related to the service.  The patient expressed understanding and agrees to proceed.    Chief Complaint  Patient presents with  . Telephone Screen  . Telephone Assessment  . Hypertension  . Medication Refill    xanax    HPI  Pt is seen via video today.  He is seen for follow up on on anxiety and psoriasis.  He reports he is doing well.  He would like to see a dermatologist for his psoriasis because he is curious about other treatments.  His anxiety is intermittent, and he uses xanax as needed.  He is requesting a refill on xanax at this time.  His last refill was for 20 tablets 3 months ago.    Current Medication: Outpatient Encounter Medications as of 09/24/2019  Medication Sig  . ALPRAZolam (XANAX) 0.25 MG tablet Half to one tab tab as needed for panic attacks  . aspirin EC 81 MG tablet Take 81 mg by mouth daily.  . Cholecalciferol (D3 VITAMIN PO) Take by mouth.  . clobetasol (TEMOVATE) 0.05 % GEL Apply 2 application topically 2 (two) times daily.  . Clobetasol Propionate (CLOBEX) 0.05 % lotion Apply 1 application topically 2 (two) times daily.  . Cyanocobalamin ([employer]S B12 GUMMIES) 500 MCG CHEW Chew 1,000 mcg by mouth 2 (two) times daily.  [name] Kitchen escitalopram (LEXAPRO) 10 MG tablet TAKE 1 TABLET(10 MG) BY MOUTH DAILY  . lisinopril (ZESTRIL) 10 MG tablet TAKE 1 TABLET(10 MG) BY MOUTH DAILY  . Omega-3 Fatty Acids (FISH OIL) 1200 MG CAPS Take  2 capsules by mouth daily.  . Omeprazole 20 MG TBEC Take 20 mg by mouth daily.  [name] Kitchen triamcinolone ointment (KENALOG) 0.5 % Apply 1 application topically 2 (two) times daily.  . [DISCONTINUED] ALPRAZolam (XANAX) 0.25 MG tablet Half to one tab tab as needed for panic attacks  . [DISCONTINUED] clobetasol (TEMOVATE) 0.05 % GEL Apply 2 application topically 2 (two) times daily.  . [DISCONTINUED] Clobetasol Propionate (CLOBEX) 0.05 % lotion Apply 1 application topically 2 (two) times daily.  . [DISCONTINUED] triamcinolone ointment (KENALOG) 0.5 % Apply 1 application topically 2 (two) times daily.   No facility-administered encounter medications on file as of 09/24/2019.    Surgical History: Past Surgical History:  Procedure Laterality Date  . FINGER SURGERY    . heart stent      Medical History: Past Medical History:  Diagnosis Date  . GERD (gastroesophageal reflux disease)   . Heart attack (HCC)   . Hypertension     Family History: Family History  Problem Relation Age of Onset  . Heart disease Father   . Heart disease Maternal Grandmother   . Heart disease Paternal Grandfather     Social History   Socioeconomic History  . Marital status: Single    Spouse name: Not on file  . Number of children: Not on file  . Years of education: Not on file  . Highest education level: Not on file  Occupational History  .  Not on file  Tobacco Use  . Smoking status: Former Games developer  . Smokeless tobacco: Never Used  Substance and Sexual Activity  . Alcohol use: Not Currently  . Drug use: Never  . Sexual activity: Not on file  Other Topics Concern  . Not on file  Social History Narrative  . Not on file   Social Determinants of Health   Financial Resource Strain:   . Difficulty of Paying Living Expenses: Not on file  Food Insecurity:   . Worried About Programme researcher, broadcasting/film/video in the Last Year: Not on file  . Ran Out of Food in the Last Year: Not on file  Transportation Needs:   . Lack of  Transportation (Medical): Not on file  . Lack of Transportation (Non-Medical): Not on file  Physical Activity:   . Days of Exercise per Week: Not on file  . Minutes of Exercise per Session: Not on file  Stress:   . Feeling of Stress : Not on file  Social Connections:   . Frequency of Communication with Friends and Family: Not on file  . Frequency of Social Gatherings with Friends and Family: Not on file  . Attends Religious Services: Not on file  . Active Member of Clubs or Organizations: Not on file  . Attends Banker Meetings: Not on file  . Marital Status: Not on file  Intimate Partner Violence:   . Fear of Current or Ex-Partner: Not on file  . Emotionally Abused: Not on file  . Physically Abused: Not on file  . Sexually Abused: Not on file      Review of Systems  Constitutional: Negative.  Negative for chills, fatigue and unexpected weight change.  HENT: Negative.  Negative for congestion, rhinorrhea, sneezing and sore throat.   Eyes: Negative for redness.  Respiratory: Negative.  Negative for cough, chest tightness and shortness of breath.   Cardiovascular: Negative.  Negative for chest pain and palpitations.  Gastrointestinal: Negative.  Negative for abdominal pain, constipation, diarrhea, nausea and vomiting.  Endocrine: Negative.   Genitourinary: Negative.  Negative for dysuria and frequency.  Musculoskeletal: Negative.  Negative for arthralgias, back pain, joint swelling and neck pain.  Skin: Negative.  Negative for rash.  Allergic/Immunologic: Negative.   Neurological: Negative.  Negative for tremors and numbness.  Hematological: Negative for adenopathy. Does not bruise/bleed easily.  Psychiatric/Behavioral: Negative.  Negative for behavioral problems, sleep disturbance and suicidal ideas. The patient is not nervous/anxious.     Vital Signs: Ht 6\' 1"  (1.854 m)   Wt 242 lb (109.8 kg)   BMI 31.93 kg/m    Observation/Objective:  Well sounding, NAD  noted at this time.   Assessment/Plan: 1. Anxiety Reviewed risks and possible side effects associated with taking opiates, benzodiazepines and other CNS depressants. Combination of these could cause dizziness and drowsiness. Advised patient not to drive or operate machinery when taking these medications, as patient's and other's life can be at risk and will have consequences. Patient verbalized understanding in this matter. Dependence and abuse for these drugs will be monitored closely. A Controlled substance policy and procedure is on file which allows [facility] to order a urine drug screen test at any visit. Patient understands and agrees with the plan - ALPRAZolam (XANAX) 0.25 MG tablet; Half to one tab tab as needed for panic attacks  Dispense: 20 tablet; Refill: 0  2. Psoriasis - Ambulatory referral to Dermatology Refilled medications.  - clobetasol (TEMOVATE) 0.05 % GEL; Apply 2 application  topically 2 (two) times daily.  Dispense: 60 g; Refill: 3 - Clobetasol Propionate (CLOBEX) 0.05 % lotion; Apply 1 application topically 2 (two) times daily.  Dispense: 118 mL; Refill: 3 - triamcinolone ointment (KENALOG) 0.5 %; Apply 1 application topically 2 (two) times daily.  Dispense: 60 g; Refill: 2  3. Essential hypertension Controled, continue present management.   General Counseling: lamarkus nebel understanding of the findings of today's phone visit and agrees with plan of treatment. I have discussed any further diagnostic evaluation that may be needed or ordered today. We also reviewed his medications today. he has been encouraged to call the office with any questions or concerns that should arise related to todays visit.    Orders Placed This Encounter  Procedures  . Ambulatory referral to Dermatology    Meds ordered this encounter  Medications  . ALPRAZolam (XANAX) 0.25 MG tablet    Sig: Half to one tab tab as needed for panic attacks    Dispense:  20 tablet     Refill:  0  . clobetasol (TEMOVATE) 0.05 % GEL    Sig: Apply 2 application topically 2 (two) times daily.    Dispense:  60 g    Refill:  3  . Clobetasol Propionate (CLOBEX) 0.05 % lotion    Sig: Apply 1 application topically 2 (two) times daily.    Dispense:  118 mL    Refill:  3  . triamcinolone ointment (KENALOG) 0.5 %    Sig: Apply 1 application topically 2 (two) times daily.    Dispense:  60 g    Refill:  2    Time spent: [place] AGNP-C Internal medicine

## 2019-11-28 ENCOUNTER — Ambulatory Visit: Payer: Self-pay | Attending: Internal Medicine

## 2019-11-28 DIAGNOSIS — Z23 Encounter for immunization: Secondary | ICD-10-CM

## 2019-11-28 NOTE — Progress Notes (Signed)
   [id number]  Name:  Robert KEANEY Sr.    MRN: [id number] DOB: [birth year]  11/28/2019  Mr. Robert Clark was observed post Covid-19 immunization for 15 minutes without incident. He was provided with Vaccine Information Sheet and instruction to access the V-Safe system.   Mr. Robert Clark was instructed to call 911 with any severe reactions post vaccine: [name] Kitchen Difficulty breathing  . Swelling of face and throat  . A fast heartbeat  . A bad rash all over body  . Dizziness and weakness   Immunizations Administered    Name Date Dose VIS Date Route   Pfizer COVID-19 Vaccine 11/28/2019 11:33 AM 0.3 mL 08/21/2019 Intramuscular   Manufacturer: [employer], [employer]   Lot: [id number]   NDC: [address]-7

## 2019-12-23 ENCOUNTER — Ambulatory Visit: Payer: Self-pay | Attending: Internal Medicine

## 2019-12-23 DIAGNOSIS — Z23 Encounter for immunization: Secondary | ICD-10-CM

## 2019-12-23 NOTE — Progress Notes (Signed)
   [id number]  Name:  Robert KORBER Sr.    MRN: [id number] DOB: 1965/11/22  12/23/2019  Robert Clark was observed post Covid-19 immunization for 15 minutes without incident. He was provided with Vaccine Information Sheet and instruction to access the V-Safe system.   Robert Clark was instructed to call 911 with any severe reactions post vaccine: [name] Kitchen Difficulty breathing  . Swelling of face and throat  . A fast heartbeat  . A bad rash all over body  . Dizziness and weakness   Immunizations Administered    Name Date Dose VIS Date Route   Pfizer COVID-19 Vaccine 12/23/2019  9:57 AM 0.3 mL 08/21/2019 Intramuscular   Manufacturer: [employer], [employer]   Lot: [id number]   NDC: [address]-7

## 2020-01-04 ENCOUNTER — Other Ambulatory Visit: Payer: Self-pay | Admitting: Adult Health

## 2020-01-04 DIAGNOSIS — I10 Essential (primary) hypertension: Secondary | ICD-10-CM

## 2020-01-20 ENCOUNTER — Telehealth: Payer: Self-pay

## 2020-01-20 NOTE — Telephone Encounter (Signed)
Confirmed and screened for 01-22-20 ov. 

## 2020-01-20 NOTE — Telephone Encounter (Signed)
Lmom for  01-22-20 ov. 

## 2020-01-22 ENCOUNTER — Ambulatory Visit (INDEPENDENT_AMBULATORY_CARE_PROVIDER_SITE_OTHER): Payer: BC Managed Care – PPO | Admitting: Nurse Practitioner

## 2020-01-22 ENCOUNTER — Encounter: Payer: Self-pay | Admitting: Nurse Practitioner

## 2020-01-22 VITALS — BP 114/63 | HR 81 | Temp 96.8°F | Resp 16 | Ht 73.0 in | Wt 244.8 lb

## 2020-01-22 DIAGNOSIS — F419 Anxiety disorder, unspecified: Secondary | ICD-10-CM

## 2020-01-22 DIAGNOSIS — I10 Essential (primary) hypertension: Secondary | ICD-10-CM | POA: Diagnosis not present

## 2020-01-22 MED ORDER — ALPRAZOLAM 0.25 MG PO TABS: ORAL_TABLET | ORAL | 1 refills | Status: AC

## 2020-01-22 NOTE — Progress Notes (Signed)
[facility] [address] [place], [place] [address]  Internal MEDICINE  Office Visit Note  Patient Name: Robert Clark  [id number]  [id number]  Date of Service: 01/30/2020  Chief Complaint  Patient presents with  . Hypertension  . Gastroesophageal Reflux  . Medication Refill    xanax    Patient is here for routine follow up. Blood pressure is well managed with current medication. He states that he continues to have some problems with anxiety. He will take alprazolam 0.25mg  daily if needed. He does need refill alprazolam today. His last fill was in 09/2019 for #20 tablets. Has been out for some time. He denies other concerns or complaints.       Current Medication: Outpatient Encounter Medications as of 01/22/2020  Medication Sig Note  . ALPRAZolam (XANAX) 0.25 MG tablet Half to one tab tab as needed for panic attacks   . aspirin EC 81 MG tablet Take 81 mg by mouth daily.   . Cholecalciferol (D3 VITAMIN PO) Take by mouth.   . clobetasol (TEMOVATE) 0.05 % GEL Apply 2 application topically 2 (two) times daily.   . Clobetasol Propionate (CLOBEX) 0.05 % lotion Apply 1 application topically 2 (two) times daily.   . Cyanocobalamin ([employer] B12 GUMMIES) 500 MCG CHEW Chew 1,000 mcg by mouth 2 (two) times daily.   [name] Kitchen lisinopril (ZESTRIL) 10 MG tablet TAKE 1 TABLET(10 MG) BY MOUTH DAILY   . Omega-3 Fatty Acids (FISH OIL) 1200 MG CAPS Take 2 capsules by mouth daily.   . Omeprazole 20 MG TBEC Take 20 mg by mouth daily.   [name] Kitchen triamcinolone ointment (KENALOG) 0.5 % Apply 1 application topically 2 (two) times daily.   . [DISCONTINUED] ALPRAZolam (XANAX) 0.25 MG tablet Half to one tab tab as needed for panic attacks   . [DISCONTINUED] escitalopram (LEXAPRO) 10 MG tablet TAKE 1 TABLET(10 MG) BY MOUTH DAILY 01/22/2020: patient states that he does not take this any longer.    No facility-administered encounter medications on file as of 01/22/2020.    Surgical History: Past Surgical  History:  Procedure Laterality Date  . FINGER SURGERY    . heart stent      Medical History: Past Medical History:  Diagnosis Date  . GERD (gastroesophageal reflux disease)   . Heart attack (HCC)   . Hypertension     Family History: Family History  Problem Relation Age of Onset  . Heart disease Father   . Heart disease Maternal Grandmother   . Heart disease Paternal Grandfather     Social History   Socioeconomic History  . Marital status: Single    Spouse name: Not on file  . Number of children: Not on file  . Years of education: Not on file  . Highest education level: Not on file  Occupational History  . Not on file  Tobacco Use  . Smoking status: Former Games developer  . Smokeless tobacco: Never Used  Substance and Sexual Activity  . Alcohol use: Not Currently  . Drug use: Never  . Sexual activity: Not on file  Other Topics Concern  . Not on file  Social History Narrative  . Not on file   Social Determinants of Health   Financial Resource Strain:   . Difficulty of Paying Living Expenses:   Food Insecurity:   . Worried About Programme researcher, broadcasting/film/video in the Last Year:   . Barista in the Last Year:   Transportation Needs:   . Lack  of Transportation (Medical):   [name] Kitchen Lack of Transportation (Non-Medical):   Physical Activity:   . Days of Exercise per Week:   . Minutes of Exercise per Session:   Stress:   . Feeling of Stress :   Social Connections:   . Frequency of Communication with Friends and Family:   . Frequency of Social Gatherings with Friends and Family:   . Attends Religious Services:   . Active Member of Clubs or Organizations:   . Attends Archivist Meetings:   [name] Kitchen Marital Status:   Intimate Partner Violence:   . Fear of Current or Ex-Partner:   . Emotionally Abused:   [name] Kitchen Physically Abused:   . Sexually Abused:       Review of Systems  Constitutional: Positive for fatigue. Negative for chills and unexpected weight change.  HENT:  Negative for congestion, rhinorrhea, sneezing and sore throat.   Respiratory: Negative for cough, chest tightness, shortness of breath and wheezing.   Cardiovascular: Negative for chest pain and palpitations.  Gastrointestinal: Negative for abdominal pain, constipation, diarrhea, nausea and vomiting.  Endocrine: Negative for cold intolerance, heat intolerance, polydipsia and polyuria.  Musculoskeletal: Negative for arthralgias, back pain, joint swelling and neck pain.  Skin: Negative for rash.  Allergic/Immunologic: Negative for environmental allergies.  Neurological: Negative for dizziness, tremors, numbness and headaches.  Hematological: Negative for adenopathy. Does not bruise/bleed easily.  Psychiatric/Behavioral: Negative for behavioral problems, sleep disturbance and suicidal ideas. The patient is nervous/anxious.     Today's Vitals   01/22/20 1431  BP: 114/63  Pulse: 81  Resp: 16  Temp: (!) 96.8 F (36 C)  SpO2: 96%  Weight: 244 lb 12.8 oz (111 kg)  Height: 6\' 1"  (1.854 m)   Body mass index is 32.3 kg/m.  Physical Exam Vitals and nursing note reviewed.  Constitutional:      General: He is not in acute distress.    Appearance: Normal appearance. He is well-developed. He is not diaphoretic.  HENT:     Head: Normocephalic and atraumatic.     Mouth/Throat:     Pharynx: No oropharyngeal exudate.  Eyes:     Conjunctiva/sclera: Conjunctivae normal.     Pupils: Pupils are equal, round, and reactive to light.  Neck:     Thyroid: No thyromegaly.     Vascular: No JVD.     Trachea: No tracheal deviation.  Cardiovascular:     Rate and Rhythm: Normal rate and regular rhythm.     Heart sounds: Normal heart sounds. No murmur. No friction rub. No gallop.   Pulmonary:     Effort: Pulmonary effort is normal. No respiratory distress.     Breath sounds: Normal breath sounds. No wheezing or rales.  Chest:     Chest wall: No tenderness.  Abdominal:     Palpations: Abdomen is soft.   Musculoskeletal:        General: Normal range of motion.     Cervical back: Normal range of motion and neck supple.  Lymphadenopathy:     Cervical: No cervical adenopathy.  Skin:    General: Skin is warm and dry.  Neurological:     Mental Status: He is alert and oriented to person, place, and time.     Cranial Nerves: No cranial nerve deficit.  Psychiatric:        Attention and Perception: Attention and perception normal.        Mood and Affect: Affect normal. Mood is anxious.  Speech: Speech normal.        Behavior: Behavior normal. Behavior is cooperative.        Thought Content: Thought content normal.        Cognition and Memory: Cognition and memory normal.        Judgment: Judgment normal.    Assessment/Plan: 1. Essential hypertension Blood pressure stable. Continue lisinopril as prescribed   2. Anxiety May continue to take alprazolam 0.25mg  daily as needed for acute anxiety. New prescription sent to his pharmacy today.  - ALPRAZolam (XANAX) 0.25 MG tablet; Half to one tab tab as needed for panic attacks  Dispense: 30 tablet; Refill: 1  General Counseling: Lakshya verbalizes understanding of the findings of todays visit and agrees with plan of treatment. I have discussed any further diagnostic evaluation that may be needed or ordered today. We also reviewed his medications today. he has been encouraged to call the office with any questions or concerns that should arise related to todays visit.  This patient was seen by Vincent Gros FNP Collaboration with Dr Lyndon Code as a part of collaborative care agreement  Meds ordered this encounter  Medications  . ALPRAZolam (XANAX) 0.25 MG tablet    Sig: Half to one tab tab as needed for panic attacks    Dispense:  30 tablet    Refill:  1    Order Specific Question:   Supervising Provider    Answer:   Lyndon Code [[id number]]    Total time spent: 25 Minutes   Time spent includes review of chart, medications, test  results, and follow up plan with the patient.      Dr Lyndon Code Internal medicine

## 2020-01-30 DIAGNOSIS — F419 Anxiety disorder, unspecified: Secondary | ICD-10-CM | POA: Insufficient documentation

## 2020-01-30 DIAGNOSIS — I10 Essential (primary) hypertension: Secondary | ICD-10-CM | POA: Insufficient documentation

## 2020-02-10 ENCOUNTER — Telehealth: Payer: Self-pay

## 2020-02-10 NOTE — Telephone Encounter (Signed)
Confirmed and screened for 02-12-20 ov. 

## 2020-02-12 ENCOUNTER — Other Ambulatory Visit: Payer: Self-pay

## 2020-02-12 ENCOUNTER — Ambulatory Visit: Payer: BC Managed Care – PPO | Admitting: Nurse Practitioner

## 2020-02-12 ENCOUNTER — Ambulatory Visit
Admission: RE | Admit: 2020-02-12 | Discharge: 2020-02-12 | Disposition: A | Discharge status: Home / Self Care | Payer: BC Managed Care – PPO | Source: Ambulatory Visit | Attending: Nurse Practitioner | Admitting: Nurse Practitioner

## 2020-02-12 ENCOUNTER — Encounter: Payer: Self-pay | Admitting: Nurse Practitioner

## 2020-02-12 ENCOUNTER — Ambulatory Visit
Admission: RE | Admit: 2020-02-12 | Discharge: 2020-02-12 | Disposition: A | Discharge status: Home / Self Care | Payer: BC Managed Care – PPO | Attending: Nurse Practitioner | Admitting: Nurse Practitioner

## 2020-02-12 VITALS — BP 128/74 | HR 92 | Temp 97.5°F | Resp 16 | Ht 73.0 in | Wt 244.2 lb

## 2020-02-12 DIAGNOSIS — M25552 Pain in left hip: Secondary | ICD-10-CM

## 2020-02-12 DIAGNOSIS — S72052A Unspecified fracture of head of left femur, initial encounter for closed fracture: Secondary | ICD-10-CM | POA: Diagnosis not present

## 2020-02-12 MED ORDER — ETODOLAC 400 MG PO TABS: 400.0000 mg | ORAL_TABLET | Freq: Two times a day (BID) | ORAL | 3 refills | Status: AC

## 2020-02-12 MED ORDER — PREDNISONE 10 MG (21) PO TBPK: ORAL_TABLET | ORAL | 0 refills | Status: AC

## 2020-02-12 MED ORDER — HYDROCODONE-ACETAMINOPHEN 5-325 MG PO TABS: 1.0000 | ORAL_TABLET | Freq: Four times a day (QID) | ORAL | 0 refills | Status: DC | PRN

## 2020-02-12 NOTE — Progress Notes (Signed)
[facility] [name], [place] [address]  Internal MEDICINE  Office Visit Note  Patient Name: Robert Clark  [id number]  [id number]  Date of Service: 02/17/2020    Pt is here for a sick visit.  Chief Complaint  Patient presents with  . Hip Pain    left side of hip, moving into thigh/legs, fell at work 6 months ago     The patient is here for acute visit. He states that about six months ago, he fell at work, left leg getting caught up underneath him. Gradually, the pain in the hip has become worse. Right hip and right upper leg is very sore. Sometimes there is pain in the left groin area. Hurts along the outer aspect of the leg and groin will hurt more when flexing at the hip joint.  Has been taking tylenol, ibuprofen, and lots of BCs to help with the pain. These have not helped. He states that pain is really unbearable. Pain is rated about 9/10 in severity.       Current Medication:  Outpatient Encounter Medications as of 02/12/2020  Medication Sig  . ALPRAZolam (XANAX) 0.25 MG tablet Half to one tab tab as needed for panic attacks  . aspirin EC 81 MG tablet Take 81 mg by mouth daily.  . Cholecalciferol (D3 VITAMIN PO) Take by mouth.  . clobetasol (TEMOVATE) 0.05 % GEL Apply 2 application topically 2 (two) times daily.  . Clobetasol Propionate (CLOBEX) 0.05 % lotion Apply 1 application topically 2 (two) times daily.  . Cyanocobalamin (CVS B12 GUMMIES) 500 MCG CHEW Chew 1,000 mcg by mouth 2 (two) times daily.  [name] Kitchen lisinopril (ZESTRIL) 10 MG tablet TAKE 1 TABLET(10 MG) BY MOUTH DAILY  . Omega-3 Fatty Acids (FISH OIL) 1200 MG CAPS Take 2 capsules by mouth daily.  . Omeprazole 20 MG TBEC Take 20 mg by mouth daily.  [name] Kitchen triamcinolone ointment (KENALOG) 0.5 % Apply 1 application topically 2 (two) times daily.  [name] Kitchen etodolac (LODINE) 400 MG tablet Take 1 tablet (400 mg total) by mouth 2 (two) times daily.  [name] Kitchen HYDROcodone-acetaminophen (NORCO/VICODIN) 5-325 MG  tablet Take 1 tablet by mouth every 6 (six) hours as needed for moderate pain.  . predniSONE (STERAPRED UNI-PAK 21 TAB) 10 MG (21) TBPK tablet 6 day taper - take by mouth as directed for 6 days   No facility-administered encounter medications on file as of 02/12/2020.      Medical History: Past Medical History:  Diagnosis Date  . GERD (gastroesophageal reflux disease)   . Heart attack ([facility])   . Hypertension      Today's Vitals   02/12/20 0926  BP: 128/74  Pulse: 92  Resp: 16  Temp: (!) 97.5 F (36.4 C)  SpO2: 95%  Weight: 244 lb 3.2 oz (110.8 kg)  Height: 6\' 1"  (1.854 m)   Body mass index is 32.22 kg/m.  Review of Systems  Constitutional: Positive for fatigue. Negative for chills and unexpected weight change.  HENT: Negative for congestion, rhinorrhea, sneezing and sore throat.   Respiratory: Negative for cough, chest tightness, shortness of breath and wheezing.   Cardiovascular: Negative for chest pain and palpitations.  Gastrointestinal: Negative for abdominal pain, constipation, diarrhea, nausea and vomiting.  Endocrine: Negative for cold intolerance, heat intolerance, polydipsia and polyuria.  Musculoskeletal: Positive for arthralgias, gait problem and myalgias. Negative for back pain, joint swelling and neck pain.       Left hip and left groin pain. More severe  with flexion and extension of the left hip. Hurting him to walk and starting to interfere with his activities of daily living.   Skin: Negative for rash.  Allergic/Immunologic: Negative for environmental allergies.  Neurological: Negative for dizziness, tremors, numbness and headaches.  Hematological: Negative for adenopathy. Does not bruise/bleed easily.  Psychiatric/Behavioral: Negative for behavioral problems, sleep disturbance and suicidal ideas. The patient is nervous/anxious.     Physical Exam Vitals and nursing note reviewed.  Constitutional:      General: He is not in acute distress.    Appearance:  Normal appearance. He is well-developed. He is not diaphoretic.  HENT:     Head: Normocephalic and atraumatic.     Mouth/Throat:     Pharynx: No oropharyngeal exudate.  Eyes:     Conjunctiva/sclera: Conjunctivae normal.     Pupils: Pupils are equal, round, and reactive to light.  Neck:     Thyroid: No thyromegaly.     Vascular: No JVD.     Trachea: No tracheal deviation.  Cardiovascular:     Rate and Rhythm: Normal rate and regular rhythm.     Heart sounds: Normal heart sounds. No murmur. No friction rub. No gallop.   Pulmonary:     Effort: Pulmonary effort is normal. No respiratory distress.     Breath sounds: Normal breath sounds. No wheezing or rales.  Chest:     Chest wall: No tenderness.  Abdominal:     Palpations: Abdomen is soft.  Musculoskeletal:        General: Tenderness present. Normal range of motion.     Cervical back: Normal range of motion and neck supple.     Comments: There is moderate tenderness of left hip which radiates into the left groin. There is increased pain with flexion and extension of the left leg. Crepitus can be felt with movement. No bony abnormalities or deformities are noted at this time.   Lymphadenopathy:     Cervical: No cervical adenopathy.  Skin:    General: Skin is warm and dry.  Neurological:     Mental Status: He is alert and oriented to person, place, and time.     Cranial Nerves: No cranial nerve deficit.  Psychiatric:        Attention and Perception: Attention and perception normal.        Mood and Affect: Affect normal. Mood is anxious.        Speech: Speech normal.        Behavior: Behavior normal. Behavior is cooperative.        Thought Content: Thought content normal.        Cognition and Memory: Cognition and memory normal.        Judgment: Judgment normal.   Assessment/Plan:  1. Left hip pain Start etodolac 400mg  twice daily for pain/inflammation. Add prednisone taper. Take as directed for 6 days. May take hydrocodone/APAP  5/325mg  tablets at bedtime or when at home resting to reduce severe pain/ will get x-ray of the left hip for further evaluation. Refer to orthopedics as indicated.  - predniSONE (STERAPRED UNI-PAK 21 TAB) 10 MG (21) TBPK tablet; 6 day taper - take by mouth as directed for 6 days  Dispense: 21 tablet; Refill: 0 - etodolac (LODINE) 400 MG tablet; Take 1 tablet (400 mg total) by mouth 2 (two) times daily.  Dispense: 60 tablet; Refill: 3 - HYDROcodone-acetaminophen (NORCO/VICODIN) 5-325 MG tablet; Take 1 tablet by mouth every 6 (six) hours as needed for moderate pain.  Dispense:  30 tablet; Refill: 0 - DG Hip Unilat W OR W/O Pelvis 2-3 Views Left; Future  General Counseling: brendin situ understanding of the findings of todays visit and agrees with plan of treatment. I have discussed any further diagnostic evaluation that may be needed or ordered today. We also reviewed his medications today. he has been encouraged to call the office with any questions or concerns that should arise related to todays visit.    Counseling:  Reviewed risks and possible side effects associated with taking opiates, benzodiazepines and other CNS depressants. Combination of these could cause dizziness and drowsiness. Advised patient not to drive or operate machinery when taking these medications, as patient's and other's life can be at risk and will have consequences. Patient verbalized understanding in this matter. Dependence and abuse for these drugs will be monitored closely. A Controlled substance policy and procedure is on file which allows [facility] to order a urine drug screen test at any visit. Patient understands and agrees with the plan  This patient was seen by Vincent Gros FNP Collaboration with Dr Lyndon Code as a part of collaborative care agreement  Orders Placed This Encounter  Procedures  . DG Hip Unilat W OR W/O Pelvis 2-3 Views Left    Meds ordered this encounter  Medications  .  predniSONE (STERAPRED UNI-PAK 21 TAB) 10 MG (21) TBPK tablet    Sig: 6 day taper - take by mouth as directed for 6 days    Dispense:  21 tablet    Refill:  0    Order Specific Question:   Supervising Provider    Answer:   Lyndon Code [[id number]]  . etodolac (LODINE) 400 MG tablet    Sig: Take 1 tablet (400 mg total) by mouth 2 (two) times daily.    Dispense:  60 tablet    Refill:  3    Order Specific Question:   Supervising Provider    Answer:   Lyndon Code [[id number]]  . HYDROcodone-acetaminophen (NORCO/VICODIN) 5-325 MG tablet    Sig: Take 1 tablet by mouth every 6 (six) hours as needed for moderate pain.    Dispense:  30 tablet    Refill:  0    Order Specific Question:   Supervising Provider    Answer:   Lyndon Code [[id number]]    Time spent: 30 Minutes

## 2020-02-17 ENCOUNTER — Telehealth: Payer: Self-pay

## 2020-02-17 ENCOUNTER — Other Ambulatory Visit: Payer: Self-pay | Admitting: Nurse Practitioner

## 2020-02-17 DIAGNOSIS — M87852 Other osteonecrosis, left femur: Secondary | ICD-10-CM | POA: Diagnosis not present

## 2020-02-17 DIAGNOSIS — M25552 Pain in left hip: Secondary | ICD-10-CM | POA: Insufficient documentation

## 2020-02-17 DIAGNOSIS — S72052A Unspecified fracture of head of left femur, initial encounter for closed fracture: Secondary | ICD-10-CM

## 2020-02-17 NOTE — Telephone Encounter (Signed)
PATIENT WAS NOTIFIED. LET PT KNOW THAT WE ARE GETTING A REFERRAL TO ORTHO ASAP. SENT KIM MESSAGE.

## 2020-02-17 NOTE — Telephone Encounter (Signed)
-----   Message from Carlean Jews, NP sent at 02/17/2020 12:06 PM EDT ----- Please let the patient know that x-ray of his hip does show fracture of the left femoral head as well as avascular necrosis. He needs to have URGENT referral to orthopedics. I can't send this message to kim so can you pass it along? Thanks.

## 2020-02-17 NOTE — Progress Notes (Signed)
Urgent referral to orthopedics made due to fracture of left femroal head and avascular necrosis.

## 2020-02-17 NOTE — Telephone Encounter (Signed)
Urgent referral to orthopedics made due to fracture of left femroal head and avascular necrosis.

## 2020-02-17 NOTE — Progress Notes (Signed)
Please let the patient know that x-ray of his hip does show fracture of the left femoral head as well as avascular necrosis. He needs to have URGENT referral to orthopedics. I can't send this message to kim so can you pass it along? Thanks.

## 2020-02-24 DIAGNOSIS — M87852 Other osteonecrosis, left femur: Secondary | ICD-10-CM | POA: Diagnosis not present

## 2020-02-29 ENCOUNTER — Other Ambulatory Visit: Payer: Self-pay | Admitting: Nurse Practitioner

## 2020-02-29 DIAGNOSIS — M87859 Other osteonecrosis, unspecified femur: Secondary | ICD-10-CM | POA: Diagnosis not present

## 2020-02-29 DIAGNOSIS — M25552 Pain in left hip: Secondary | ICD-10-CM

## 2020-02-29 MED ORDER — HYDROCODONE-ACETAMINOPHEN 5-325 MG PO TABS: 1.0000 | ORAL_TABLET | Freq: Four times a day (QID) | ORAL | 0 refills | Status: AC | PRN

## 2020-02-29 NOTE — Progress Notes (Signed)
Renewed single prescription for hydrocodone/APAP 5/325mg  tablets. rx for #30 tablets sent to [facility].

## 2020-03-09 DIAGNOSIS — M87852 Other osteonecrosis, left femur: Secondary | ICD-10-CM | POA: Diagnosis not present

## 2020-03-09 DIAGNOSIS — M1612 Unilateral primary osteoarthritis, left hip: Secondary | ICD-10-CM | POA: Diagnosis not present

## 2020-03-10 ENCOUNTER — Telehealth: Payer: Self-pay

## 2020-03-10 NOTE — Telephone Encounter (Signed)
Pt  Wife called that need more pain med as per heather advised need to call orthopedic for further eval/np

## 2020-03-11 DIAGNOSIS — Z01818 Encounter for other preprocedural examination: Secondary | ICD-10-CM | POA: Diagnosis not present

## 2020-03-11 DIAGNOSIS — M169 Osteoarthritis of hip, unspecified: Secondary | ICD-10-CM | POA: Diagnosis not present

## 2020-03-11 DIAGNOSIS — M87859 Other osteonecrosis, unspecified femur: Secondary | ICD-10-CM | POA: Diagnosis not present

## 2020-03-17 DIAGNOSIS — Z87442 Personal history of urinary calculi: Secondary | ICD-10-CM | POA: Diagnosis not present

## 2020-03-17 DIAGNOSIS — M87052 Idiopathic aseptic necrosis of left femur: Secondary | ICD-10-CM | POA: Diagnosis not present

## 2020-03-17 DIAGNOSIS — Z6832 Body mass index (BMI) 32.0-32.9, adult: Secondary | ICD-10-CM | POA: Diagnosis not present

## 2020-03-17 DIAGNOSIS — Z955 Presence of coronary angioplasty implant and graft: Secondary | ICD-10-CM | POA: Diagnosis not present

## 2020-03-17 DIAGNOSIS — E669 Obesity, unspecified: Secondary | ICD-10-CM | POA: Diagnosis not present

## 2020-03-17 DIAGNOSIS — K219 Gastro-esophageal reflux disease without esophagitis: Secondary | ICD-10-CM | POA: Diagnosis not present

## 2020-03-17 DIAGNOSIS — F419 Anxiety disorder, unspecified: Secondary | ICD-10-CM | POA: Diagnosis not present

## 2020-03-17 DIAGNOSIS — F17290 Nicotine dependence, other tobacco product, uncomplicated: Secondary | ICD-10-CM | POA: Diagnosis not present

## 2020-03-17 DIAGNOSIS — M87852 Other osteonecrosis, left femur: Secondary | ICD-10-CM | POA: Diagnosis not present

## 2020-03-17 DIAGNOSIS — I10 Essential (primary) hypertension: Secondary | ICD-10-CM | POA: Diagnosis not present

## 2020-03-17 DIAGNOSIS — Z7982 Long term (current) use of aspirin: Secondary | ICD-10-CM | POA: Diagnosis not present

## 2020-03-17 DIAGNOSIS — M1612 Unilateral primary osteoarthritis, left hip: Secondary | ICD-10-CM | POA: Diagnosis not present

## 2020-03-17 DIAGNOSIS — I252 Old myocardial infarction: Secondary | ICD-10-CM | POA: Diagnosis not present

## 2020-03-17 DIAGNOSIS — I2510 Atherosclerotic heart disease of native coronary artery without angina pectoris: Secondary | ICD-10-CM | POA: Diagnosis not present

## 2020-03-18 DIAGNOSIS — E669 Obesity, unspecified: Secondary | ICD-10-CM | POA: Diagnosis not present

## 2020-03-18 DIAGNOSIS — K219 Gastro-esophageal reflux disease without esophagitis: Secondary | ICD-10-CM | POA: Diagnosis not present

## 2020-03-18 DIAGNOSIS — Z87442 Personal history of urinary calculi: Secondary | ICD-10-CM | POA: Diagnosis not present

## 2020-03-18 DIAGNOSIS — F419 Anxiety disorder, unspecified: Secondary | ICD-10-CM | POA: Diagnosis not present

## 2020-03-18 DIAGNOSIS — I252 Old myocardial infarction: Secondary | ICD-10-CM | POA: Diagnosis not present

## 2020-03-18 DIAGNOSIS — I2510 Atherosclerotic heart disease of native coronary artery without angina pectoris: Secondary | ICD-10-CM | POA: Diagnosis not present

## 2020-03-18 DIAGNOSIS — I10 Essential (primary) hypertension: Secondary | ICD-10-CM | POA: Diagnosis not present

## 2020-03-18 DIAGNOSIS — M87852 Other osteonecrosis, left femur: Secondary | ICD-10-CM | POA: Diagnosis not present

## 2020-03-18 DIAGNOSIS — Z7982 Long term (current) use of aspirin: Secondary | ICD-10-CM | POA: Diagnosis not present

## 2020-03-18 DIAGNOSIS — F17290 Nicotine dependence, other tobacco product, uncomplicated: Secondary | ICD-10-CM | POA: Diagnosis not present

## 2020-03-18 DIAGNOSIS — Z6832 Body mass index (BMI) 32.0-32.9, adult: Secondary | ICD-10-CM | POA: Diagnosis not present

## 2020-03-18 DIAGNOSIS — Z955 Presence of coronary angioplasty implant and graft: Secondary | ICD-10-CM | POA: Diagnosis not present

## 2020-03-21 DIAGNOSIS — Z96649 Presence of unspecified artificial hip joint: Secondary | ICD-10-CM | POA: Diagnosis not present

## 2020-03-21 DIAGNOSIS — M25652 Stiffness of left hip, not elsewhere classified: Secondary | ICD-10-CM | POA: Diagnosis not present

## 2020-03-21 DIAGNOSIS — M25552 Pain in left hip: Secondary | ICD-10-CM | POA: Diagnosis not present

## 2020-03-29 ENCOUNTER — Telehealth: Payer: Self-pay

## 2020-03-29 NOTE — Telephone Encounter (Signed)
Medical record request completed and requested records mailed to [facility].

## 2020-03-31 DIAGNOSIS — M25552 Pain in left hip: Secondary | ICD-10-CM | POA: Diagnosis not present

## 2020-03-31 DIAGNOSIS — Z96649 Presence of unspecified artificial hip joint: Secondary | ICD-10-CM | POA: Diagnosis not present

## 2020-03-31 DIAGNOSIS — M25652 Stiffness of left hip, not elsewhere classified: Secondary | ICD-10-CM | POA: Diagnosis not present

## 2020-04-01 DIAGNOSIS — Z96642 Presence of left artificial hip joint: Secondary | ICD-10-CM | POA: Diagnosis not present

## 2020-04-05 DIAGNOSIS — M25552 Pain in left hip: Secondary | ICD-10-CM | POA: Diagnosis not present

## 2020-04-05 DIAGNOSIS — Z96649 Presence of unspecified artificial hip joint: Secondary | ICD-10-CM | POA: Diagnosis not present

## 2020-04-05 DIAGNOSIS — M25652 Stiffness of left hip, not elsewhere classified: Secondary | ICD-10-CM | POA: Diagnosis not present

## 2020-04-07 DIAGNOSIS — M25652 Stiffness of left hip, not elsewhere classified: Secondary | ICD-10-CM | POA: Diagnosis not present

## 2020-04-07 DIAGNOSIS — M25552 Pain in left hip: Secondary | ICD-10-CM | POA: Diagnosis not present

## 2020-04-07 DIAGNOSIS — Z96649 Presence of unspecified artificial hip joint: Secondary | ICD-10-CM | POA: Diagnosis not present

## 2020-04-11 DIAGNOSIS — M25552 Pain in left hip: Secondary | ICD-10-CM | POA: Diagnosis not present

## 2020-04-11 DIAGNOSIS — M25652 Stiffness of left hip, not elsewhere classified: Secondary | ICD-10-CM | POA: Diagnosis not present

## 2020-04-11 DIAGNOSIS — Z96649 Presence of unspecified artificial hip joint: Secondary | ICD-10-CM | POA: Diagnosis not present

## 2020-04-19 ENCOUNTER — Telehealth: Payer: Self-pay

## 2020-04-19 NOTE — Telephone Encounter (Signed)
Confirmed and screened for 04-22-20 ov. 

## 2020-04-22 ENCOUNTER — Encounter: Payer: BC Managed Care – PPO | Admitting: Nurse Practitioner

## 2020-04-25 DIAGNOSIS — M25552 Pain in left hip: Secondary | ICD-10-CM | POA: Diagnosis not present

## 2020-04-25 DIAGNOSIS — M25652 Stiffness of left hip, not elsewhere classified: Secondary | ICD-10-CM | POA: Diagnosis not present

## 2020-04-25 DIAGNOSIS — Z96649 Presence of unspecified artificial hip joint: Secondary | ICD-10-CM | POA: Diagnosis not present

## 2020-05-04 ENCOUNTER — Other Ambulatory Visit: Payer: Self-pay | Admitting: Adult Health

## 2020-05-04 DIAGNOSIS — I10 Essential (primary) hypertension: Secondary | ICD-10-CM

## 2020-08-28 ENCOUNTER — Other Ambulatory Visit: Payer: Self-pay | Admitting: Adult Health

## 2020-08-28 DIAGNOSIS — I10 Essential (primary) hypertension: Secondary | ICD-10-CM

## 2020-09-01 ENCOUNTER — Telehealth: Payer: Self-pay

## 2020-09-01 ENCOUNTER — Other Ambulatory Visit: Payer: Self-pay

## 2020-09-01 DIAGNOSIS — I10 Essential (primary) hypertension: Secondary | ICD-10-CM

## 2020-09-01 MED ORDER — LISINOPRIL 10 MG PO TABS: 10.0000 mg | ORAL_TABLET | Freq: Every day | ORAL | 0 refills | Status: DC

## 2020-09-01 NOTE — Telephone Encounter (Signed)
Gave thaun to make appt

## 2020-09-01 NOTE — Telephone Encounter (Signed)
LMOM-sent med refill for 30 days, need to make an appt for phys and med check

## 2020-09-28 ENCOUNTER — Other Ambulatory Visit: Payer: Self-pay | Admitting: Hospice and Palliative Medicine

## 2020-09-28 DIAGNOSIS — I10 Essential (primary) hypertension: Secondary | ICD-10-CM

## 2020-09-28 NOTE — Telephone Encounter (Signed)
LMOM for pt to call back about his refill of Lisinopril 10 mg tablets.  Pt needs to schedule appointment for refills

## 2020-10-06 ENCOUNTER — Other Ambulatory Visit: Payer: Self-pay | Admitting: Internal Medicine

## 2020-10-06 DIAGNOSIS — I10 Essential (primary) hypertension: Secondary | ICD-10-CM

## 2020-10-06 NOTE — Telephone Encounter (Signed)
Pt was a no show for physical, can refill meds for 60 days

## 2020-10-07 ENCOUNTER — Telehealth: Payer: Self-pay

## 2020-10-07 NOTE — Telephone Encounter (Signed)
Left message for patient to call to schedule physical - 60 day RF on meds

## 2020-12-11 ENCOUNTER — Other Ambulatory Visit: Payer: Self-pay | Admitting: Internal Medicine

## 2020-12-11 DIAGNOSIS — I10 Essential (primary) hypertension: Secondary | ICD-10-CM

## 2020-12-11 NOTE — Telephone Encounter (Signed)
Refill this time, will need follow up. Pt was a no show for CPE, needs one

## 2020-12-20 ENCOUNTER — Telehealth: Payer: Self-pay

## 2020-12-20 NOTE — Telephone Encounter (Signed)
lmom  Pt need appt for further refills

## 2021-01-14 ENCOUNTER — Other Ambulatory Visit: Payer: Self-pay | Admitting: Internal Medicine

## 2021-01-14 DIAGNOSIS — I10 Essential (primary) hypertension: Secondary | ICD-10-CM

## 2021-02-13 ENCOUNTER — Telehealth: Payer: Self-pay

## 2021-02-13 NOTE — Telephone Encounter (Signed)
lvm for patient to return call to schedule missed physical-Toni

## 2021-02-17 ENCOUNTER — Telehealth: Payer: Self-pay

## 2021-02-17 NOTE — Telephone Encounter (Signed)
Lvm for patient to return call to r/s cpe-Toni

## 2021-07-29 DIAGNOSIS — I4891 Unspecified atrial fibrillation: Secondary | ICD-10-CM | POA: Diagnosis not present

## 2021-07-29 DIAGNOSIS — R112 Nausea with vomiting, unspecified: Secondary | ICD-10-CM | POA: Diagnosis not present

## 2021-07-29 DIAGNOSIS — N200 Calculus of kidney: Secondary | ICD-10-CM | POA: Diagnosis not present

## 2021-07-29 DIAGNOSIS — R1013 Epigastric pain: Secondary | ICD-10-CM | POA: Diagnosis not present

## 2021-07-29 DIAGNOSIS — E860 Dehydration: Secondary | ICD-10-CM | POA: Diagnosis not present

## 2021-07-29 DIAGNOSIS — K439 Ventral hernia without obstruction or gangrene: Secondary | ICD-10-CM | POA: Diagnosis not present

## 2021-12-14 IMAGING — CR DG HIP (WITH OR WITHOUT PELVIS) 2-3V*L*
1 series · 4 of 4 positions shown · non-contrast
Comparison: CT abdomen pelvis dated December 11, 2008.

CLINICAL DATA: Progressively worsening left hip pain over the past
6 months.

EXAM:
DG HIP (WITH OR WITHOUT PELVIS) 2-3V LEFT

[Series 1: dg hip unilat w or w/o pelvis 2-3 views  · non-contrast · 0.14mm/px · 4 of 4 slices shown]
[im 1/4]
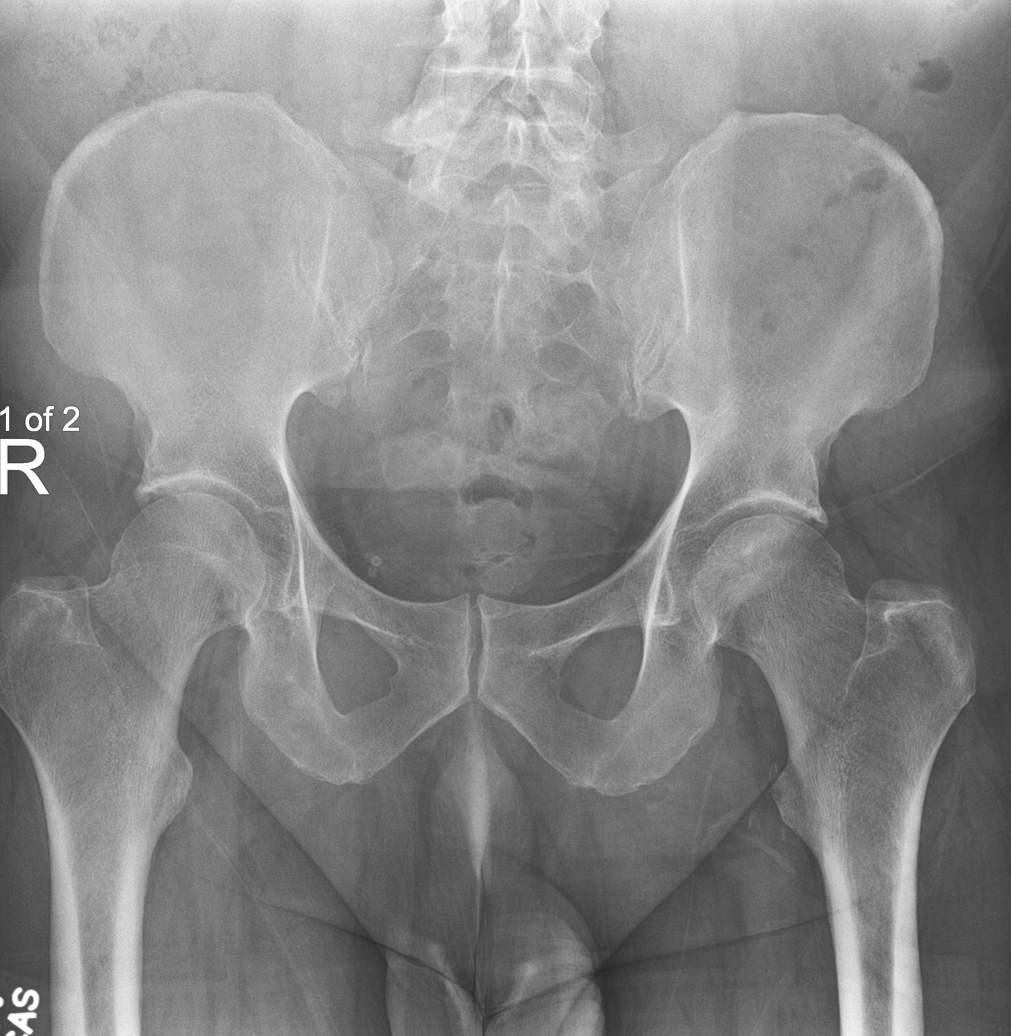
[im 2/4]
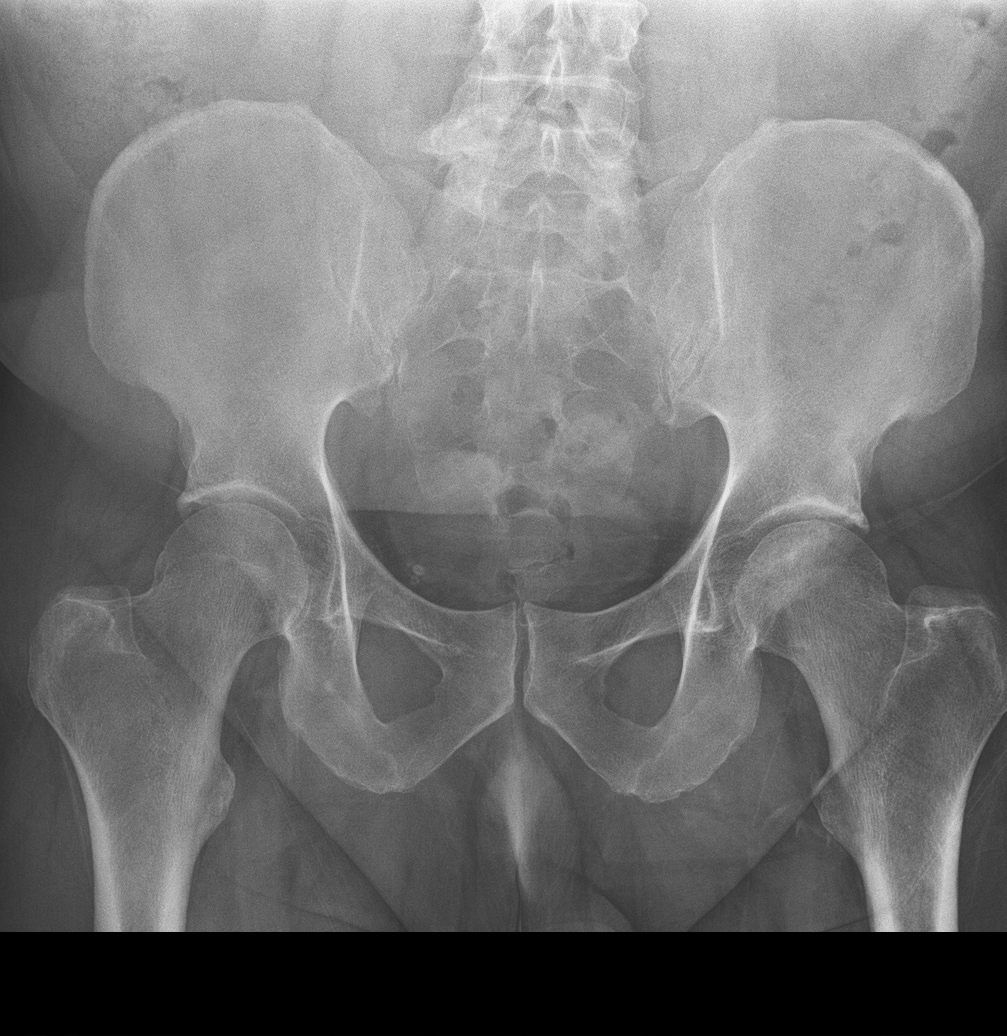
[im 3/4]
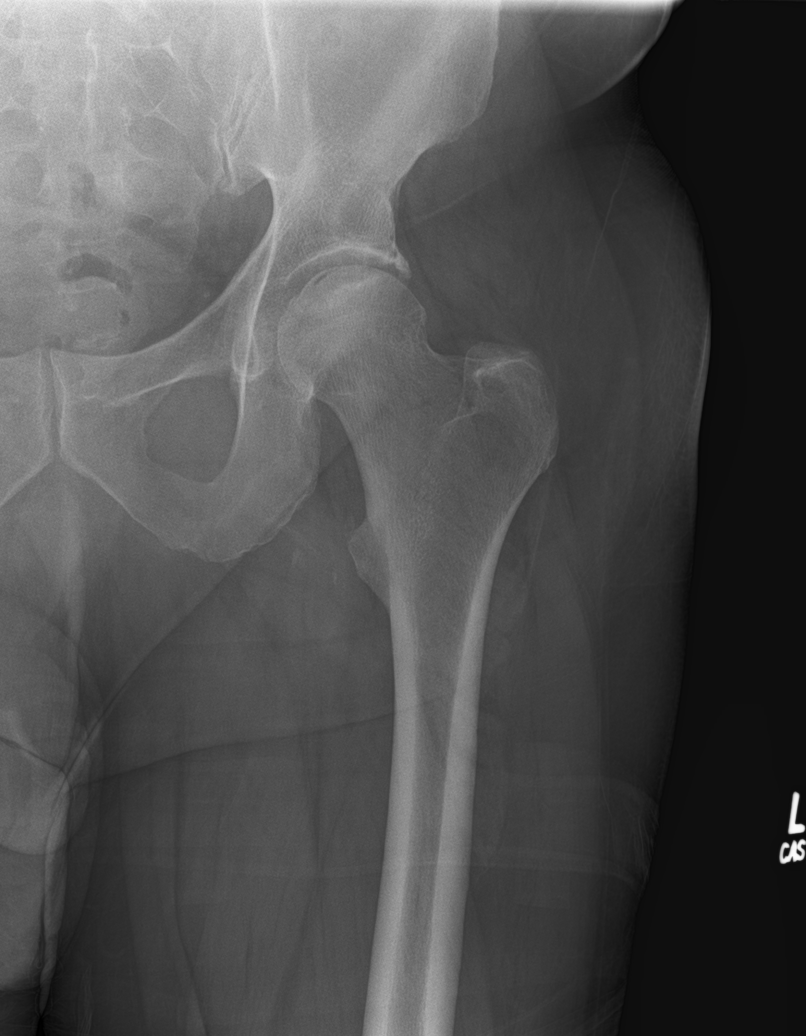
[im 4/4]
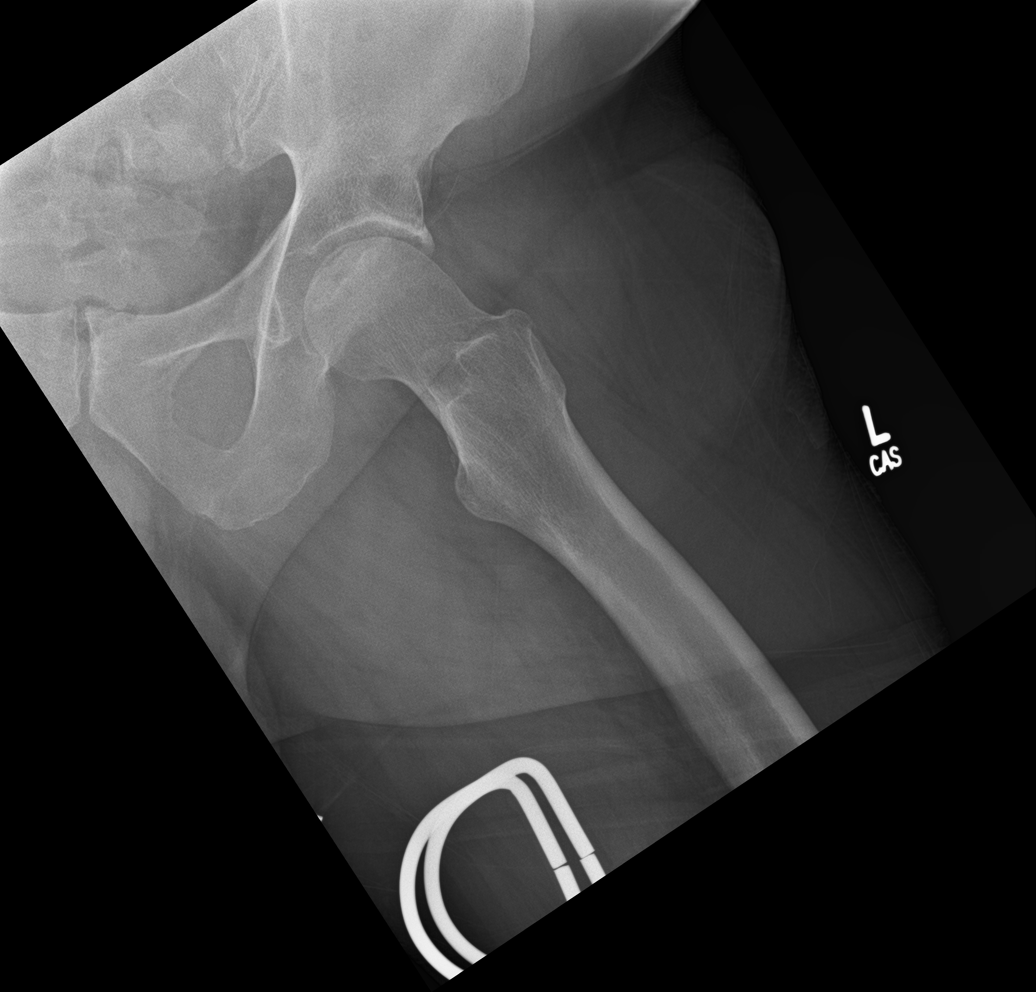

[4 of 4 positions shown; findings below may reference images not displayed]

FINDINGS: Linear sclerosis in the left femoral head with linear subchondral
lucency, consistent with avascular necrosis and subchondral
fracture. No dislocation. The hip joint spaces are preserved. Bone
mineralization is normal. Soft tissues are unremarkable.
IMPRESSION: 1. Avascular necrosis and subchondral fracture of the left femoral
head.
